# Patient Record
Sex: Male | Born: 1935 | Race: White | Hispanic: No | Marital: Married | Smoking: Former smoker
Health system: Southern US, Community
[De-identification: ages and names within clinical notes are randomized; demographics above are authoritative.]

## PROBLEM LIST (undated history)

## (undated) DIAGNOSIS — D696 Thrombocytopenia, unspecified: Secondary | ICD-10-CM

## (undated) DIAGNOSIS — N289 Disorder of kidney and ureter, unspecified: Secondary | ICD-10-CM

## (undated) DIAGNOSIS — E119 Type 2 diabetes mellitus without complications: Secondary | ICD-10-CM

## (undated) DIAGNOSIS — F32A Depression, unspecified: Secondary | ICD-10-CM

## (undated) DIAGNOSIS — K219 Gastro-esophageal reflux disease without esophagitis: Secondary | ICD-10-CM

## (undated) DIAGNOSIS — M1990 Unspecified osteoarthritis, unspecified site: Secondary | ICD-10-CM

## (undated) DIAGNOSIS — Z8719 Personal history of other diseases of the digestive system: Secondary | ICD-10-CM

## (undated) DIAGNOSIS — I2510 Atherosclerotic heart disease of native coronary artery without angina pectoris: Secondary | ICD-10-CM

## (undated) DIAGNOSIS — K3184 Gastroparesis: Secondary | ICD-10-CM

## (undated) DIAGNOSIS — J189 Pneumonia, unspecified organism: Secondary | ICD-10-CM

## (undated) DIAGNOSIS — K635 Polyp of colon: Secondary | ICD-10-CM

## (undated) DIAGNOSIS — N189 Chronic kidney disease, unspecified: Secondary | ICD-10-CM

## (undated) DIAGNOSIS — F329 Major depressive disorder, single episode, unspecified: Secondary | ICD-10-CM

## (undated) DIAGNOSIS — I10 Essential (primary) hypertension: Secondary | ICD-10-CM

## (undated) DIAGNOSIS — K5900 Constipation, unspecified: Secondary | ICD-10-CM

## (undated) DIAGNOSIS — E079 Disorder of thyroid, unspecified: Secondary | ICD-10-CM

## (undated) DIAGNOSIS — E13319 Other specified diabetes mellitus with unspecified diabetic retinopathy without macular edema: Secondary | ICD-10-CM

## (undated) DIAGNOSIS — J45909 Unspecified asthma, uncomplicated: Secondary | ICD-10-CM

## (undated) DIAGNOSIS — N400 Enlarged prostate without lower urinary tract symptoms: Secondary | ICD-10-CM

## (undated) DIAGNOSIS — K5792 Diverticulitis of intestine, part unspecified, without perforation or abscess without bleeding: Secondary | ICD-10-CM

## (undated) DIAGNOSIS — E039 Hypothyroidism, unspecified: Secondary | ICD-10-CM

## (undated) HISTORY — PX: KNEE DISLOCATION SURGERY: SHX689

## (undated) HISTORY — PX: AORTIC VALVE REPLACEMENT: SHX41

## (undated) HISTORY — PX: COLONOSCOPY: SHX174

## (undated) HISTORY — PX: SALIVARY STONE REMOVAL: SHX5213

## (undated) HISTORY — PX: CHOLECYSTECTOMY: SHX55

## (undated) HISTORY — PX: CARDIAC CATHETERIZATION: SHX172

## (undated) HISTORY — PX: HERNIA REPAIR: SHX51

---

## 2017-09-30 ENCOUNTER — Other Ambulatory Visit: Payer: Self-pay | Admitting: Neurological Surgery

## 2017-10-03 ENCOUNTER — Other Ambulatory Visit: Payer: Self-pay

## 2017-10-03 ENCOUNTER — Encounter (HOSPITAL_COMMUNITY): Payer: Self-pay | Admitting: *Deleted

## 2017-10-03 NOTE — Progress Notes (Signed)
Spoke with pt for pre-op call. Pt has hx of Aortic Valve Replacement. Sees Dr. Leeann Mustenaldo, cardiologist. Last office visit was August 2018 and was told to follow up in a year. Pt denies any recent chest pain or sob. Pt is a type 2 diabetic. Last A1C was 7.6 on 10/02/17. He stated that his fasting blood sugar has been running in the 140's which is higher than usual due to the back pain. Pt was instructed to take 1/2 of his regular dose of Levemir Insulin Sunday evening (will take 18 units). Pt instructed to check his blood sugar Monday AM when he gets up and every 2 hours until he leaves for the hospital. If blood sugar is >220 take 1/2 of usual correction dose of Humalog insulin. If blood sugar is 70 or below, treat with 1/2 cup of clear juice (apple or cranberry) and recheck blood sugar 15 minutes after drinking juice. If blood sugar continues to be 70 or below, call the Short Stay department and ask to speak to a nurse. Pt voiced understanding.

## 2017-10-03 NOTE — Progress Notes (Signed)
Anesthesia Chart Review:  Pt is a same day work up.   Pt is an 84 year old male scheduled for right L4-5 laminectomy/foraminotomy for facet/synovial cyst on 10/06/2017 with Barnett AbuHenry Elsner, MD  - PCP is Mora ApplBruce Brasher, MD (notes in care everywhere)  - Cardiologist is Gerda DissGary Reynaldo, MD. Last office visit 04/25/17; 1 year f/u recommended (notes in care everywhere)   PMH includes:  CAD, HTN, DM, CKD, thyroid disease. Former smoker. S/p aortic valve replacement with bioprosthetic valve 2009  Medications include: albuterol, ASA 81mg , carvedilol, humalog, duoneb, levemir, levothyroxine, lisinopril, lovastatin, maxzide  Labs will be obtained day of surgery  Nuclear stress test 02/27/17 (care everywhere): Normal study.  Holter monitor 01/02/16 (care everywhere):  - The rhythm throughout the tracing is atrial fibrillation with a heart rate range of 43-91 and an average of 69.Rare PVCs are present.No symptoms were reported.No pauses were present.  Echo 02/21/15 [facility]): requested  - by notes, echo showed: normal LV the function and his bioprosthetic valve has some mild aortic insufficiency but otherwise seems to be fine. He has 1-2+ mitral insufficiency and mild tricuspid insufficiency. There is been no significant decline since his previous study 2 years ago.   Cardiac cath 10/23/07 (care everywhere):  1.Severe aortic stenosis. 2.Normal intracardiac filling pressures. 3.Mild nonobstructive coronary disease.  If labs acceptable day of surgery, I anticipate pt can proceed with surgery as scheduled.   Rica Mastngela Kabbe, FNP-BC [facility] Phone: [phone] 10/03/2017 4:46 PM

## 2017-10-06 ENCOUNTER — Inpatient Hospital Stay (HOSPITAL_COMMUNITY): Payer: Medicare Other | Admitting: Emergency Medicine

## 2017-10-06 ENCOUNTER — Encounter (HOSPITAL_COMMUNITY): Payer: Self-pay | Admitting: *Deleted

## 2017-10-06 ENCOUNTER — Encounter (HOSPITAL_COMMUNITY)
Admission: RE | Disposition: A | Discharge status: Home / Self Care | Payer: Self-pay | Source: Ambulatory Visit | Attending: Neurological Surgery

## 2017-10-06 ENCOUNTER — Inpatient Hospital Stay (HOSPITAL_COMMUNITY): Payer: Medicare Other

## 2017-10-06 ENCOUNTER — Observation Stay (HOSPITAL_COMMUNITY)
Admission: RE | Admit: 2017-10-06 | Discharge: 2017-10-07 | Disposition: A | Discharge status: Home / Self Care | Payer: Medicare Other | Source: Ambulatory Visit | Attending: Neurological Surgery | Admitting: Neurological Surgery

## 2017-10-06 DIAGNOSIS — N189 Chronic kidney disease, unspecified: Secondary | ICD-10-CM | POA: Diagnosis not present

## 2017-10-06 DIAGNOSIS — M48061 Spinal stenosis, lumbar region without neurogenic claudication: Secondary | ICD-10-CM | POA: Diagnosis not present

## 2017-10-06 DIAGNOSIS — I129 Hypertensive chronic kidney disease with stage 1 through stage 4 chronic kidney disease, or unspecified chronic kidney disease: Secondary | ICD-10-CM | POA: Insufficient documentation

## 2017-10-06 DIAGNOSIS — M549 Dorsalgia, unspecified: Secondary | ICD-10-CM | POA: Diagnosis present

## 2017-10-06 DIAGNOSIS — I2510 Atherosclerotic heart disease of native coronary artery without angina pectoris: Secondary | ICD-10-CM | POA: Diagnosis not present

## 2017-10-06 DIAGNOSIS — Z79899 Other long term (current) drug therapy: Secondary | ICD-10-CM | POA: Insufficient documentation

## 2017-10-06 DIAGNOSIS — Z794 Long term (current) use of insulin: Secondary | ICD-10-CM | POA: Diagnosis not present

## 2017-10-06 DIAGNOSIS — M5116 Intervertebral disc disorders with radiculopathy, lumbar region: Principal | ICD-10-CM | POA: Insufficient documentation

## 2017-10-06 DIAGNOSIS — M4316 Spondylolisthesis, lumbar region: Secondary | ICD-10-CM | POA: Insufficient documentation

## 2017-10-06 DIAGNOSIS — E1122 Type 2 diabetes mellitus with diabetic chronic kidney disease: Secondary | ICD-10-CM | POA: Insufficient documentation

## 2017-10-06 DIAGNOSIS — I083 Combined rheumatic disorders of mitral, aortic and tricuspid valves: Secondary | ICD-10-CM | POA: Diagnosis not present

## 2017-10-06 DIAGNOSIS — Z87891 Personal history of nicotine dependence: Secondary | ICD-10-CM | POA: Diagnosis not present

## 2017-10-06 DIAGNOSIS — Z7982 Long term (current) use of aspirin: Secondary | ICD-10-CM | POA: Insufficient documentation

## 2017-10-06 DIAGNOSIS — Z952 Presence of prosthetic heart valve: Secondary | ICD-10-CM | POA: Diagnosis not present

## 2017-10-06 DIAGNOSIS — M5416 Radiculopathy, lumbar region: Secondary | ICD-10-CM | POA: Diagnosis present

## 2017-10-06 DIAGNOSIS — E079 Disorder of thyroid, unspecified: Secondary | ICD-10-CM | POA: Diagnosis not present

## 2017-10-06 DIAGNOSIS — Z419 Encounter for procedure for purposes other than remedying health state, unspecified: Secondary | ICD-10-CM

## 2017-10-06 HISTORY — DX: Polyp of colon: K63.5

## 2017-10-06 HISTORY — DX: Gastroparesis: K31.84

## 2017-10-06 HISTORY — DX: Constipation, unspecified: K59.00

## 2017-10-06 HISTORY — DX: Type 2 diabetes mellitus without complications: E11.9

## 2017-10-06 HISTORY — DX: Disorder of thyroid, unspecified: E07.9

## 2017-10-06 HISTORY — DX: Unspecified osteoarthritis, unspecified site: M19.90

## 2017-10-06 HISTORY — DX: Essential (primary) hypertension: I10

## 2017-10-06 HISTORY — DX: Unspecified asthma, uncomplicated: J45.909

## 2017-10-06 HISTORY — DX: Major depressive disorder, single episode, unspecified: F32.9

## 2017-10-06 HISTORY — DX: Thrombocytopenia, unspecified: D69.6

## 2017-10-06 HISTORY — PX: LUMBAR LAMINECTOMY/DECOMPRESSION MICRODISCECTOMY: SHX5026

## 2017-10-06 HISTORY — DX: Gastro-esophageal reflux disease without esophagitis: K21.9

## 2017-10-06 HISTORY — DX: Diverticulitis of intestine, part unspecified, without perforation or abscess without bleeding: K57.92

## 2017-10-06 HISTORY — DX: Disorder of kidney and ureter, unspecified: N28.9

## 2017-10-06 HISTORY — DX: Atherosclerotic heart disease of native coronary artery without angina pectoris: I25.10

## 2017-10-06 HISTORY — DX: Benign prostatic hyperplasia without lower urinary tract symptoms: N40.0

## 2017-10-06 HISTORY — DX: Hypothyroidism, unspecified: E03.9

## 2017-10-06 HISTORY — DX: Depression, unspecified: F32.A

## 2017-10-06 HISTORY — DX: Chronic kidney disease, unspecified: N18.9

## 2017-10-06 HISTORY — DX: Personal history of other diseases of the digestive system: Z87.19

## 2017-10-06 HISTORY — DX: Other specified diabetes mellitus with unspecified diabetic retinopathy without macular edema: E13.319

## 2017-10-06 HISTORY — DX: Pneumonia, unspecified organism: J18.9

## 2017-10-06 LAB — CBC
HCT: 43 % (ref 39.0–52.0)
HEMOGLOBIN: 14.6 g/dL (ref 13.0–17.0)
MCH: 29.1 pg (ref 26.0–34.0)
MCHC: 34 g/dL (ref 30.0–36.0)
MCV: 85.8 fL (ref 78.0–100.0)
Platelets: 116 10*3/uL — ABNORMAL LOW (ref 150–400)
RBC: 5.01 MIL/uL (ref 4.22–5.81)
RDW: 14.1 % (ref 11.5–15.5)
WBC: 6 10*3/uL (ref 4.0–10.5)

## 2017-10-06 LAB — GLUCOSE, CAPILLARY
GLUCOSE-CAPILLARY: 216 mg/dL — AB (ref 65–99)
GLUCOSE-CAPILLARY: 239 mg/dL — AB (ref 65–99)
Glucose-Capillary: 228 mg/dL — ABNORMAL HIGH (ref 65–99)
Glucose-Capillary: 297 mg/dL — ABNORMAL HIGH (ref 65–99)
Glucose-Capillary: 208 mg/dL — ABNORMAL HIGH (ref 65–99)

## 2017-10-06 LAB — BASIC METABOLIC PANEL
Anion gap: 12 (ref 5–15)
BUN: 27 mg/dL — AB (ref 6–20)
CALCIUM: 10.1 mg/dL (ref 8.9–10.3)
CO2: 24 mmol/L (ref 22–32)
CREATININE: 1.53 mg/dL — AB (ref 0.61–1.24)
Chloride: 102 mmol/L (ref 101–111)
GFR calc Af Amer: 47 mL/min — ABNORMAL LOW (ref >60)
GFR, EST NON AFRICAN AMERICAN: 40 mL/min — AB (ref >60)
Glucose, Bld: 242 mg/dL — ABNORMAL HIGH (ref 65–99)
Potassium: 4.1 mmol/L (ref 3.5–5.1)
Sodium: 138 mmol/L (ref 135–145)

## 2017-10-06 SURGERY — LUMBAR LAMINECTOMY/DECOMPRESSION MICRODISCECTOMY 1 LEVEL
Anesthesia: General | Site: Spine Lumbar | Laterality: Right

## 2017-10-06 MED ORDER — SODIUM CHLORIDE 0.9% FLUSH
3.0000 mL | Freq: Two times a day (BID) | INTRAVENOUS | Status: DC
  Administered 2017-10-06 (×2): 3 mL via INTRAVENOUS

## 2017-10-06 MED ORDER — THROMBIN (RECOMBINANT) 5000 UNITS EX SOLR
CUTANEOUS | Status: DC | PRN
  Administered 2017-10-06 (×2): 5000 [IU] via TOPICAL

## 2017-10-06 MED ORDER — VANCOMYCIN HCL IN DEXTROSE 1-5 GM/200ML-% IV SOLN
1000.0000 mg | Freq: Once | INTRAVENOUS | Status: AC
  Administered 2017-10-06: 1000 mg via INTRAVENOUS

## 2017-10-06 MED ORDER — THROMBIN (RECOMBINANT) 5000 UNITS EX SOLR
CUTANEOUS | Status: AC
  Filled 2017-10-06: qty 10000

## 2017-10-06 MED ORDER — LIDOCAINE-EPINEPHRINE 1 %-1:100000 IJ SOLN
INTRAMUSCULAR | Status: DC | PRN
  Administered 2017-10-06: 5 mL

## 2017-10-06 MED ORDER — ACETAMINOPHEN 650 MG RE SUPP: 650.0000 mg | RECTAL | Status: DC | PRN

## 2017-10-06 MED ORDER — TRIAMTERENE-HCTZ 75-50 MG PO TABS
0.5000 | ORAL_TABLET | Freq: Every day | ORAL | Status: DC
  Filled 2017-10-06: qty 0.5

## 2017-10-06 MED ORDER — ONDANSETRON HCL 4 MG/2ML IJ SOLN: 4.0000 mg | Freq: Once | INTRAMUSCULAR | Status: DC | PRN

## 2017-10-06 MED ORDER — LIDOCAINE 2% (20 MG/ML) 5 ML SYRINGE
INTRAMUSCULAR | Status: DC | PRN
  Administered 2017-10-06: 100 mg via INTRAVENOUS

## 2017-10-06 MED ORDER — FENTANYL CITRATE (PF) 100 MCG/2ML IJ SOLN
INTRAMUSCULAR | Status: DC | PRN
  Administered 2017-10-06: 100 ug via INTRAVENOUS
  Administered 2017-10-06: 50 ug via INTRAVENOUS

## 2017-10-06 MED ORDER — HYDROMORPHONE HCL 1 MG/ML IJ SOLN: 0.2500 mg | INTRAMUSCULAR | Status: DC | PRN

## 2017-10-06 MED ORDER — LACTATED RINGERS IV SOLN
INTRAVENOUS | Status: DC | PRN
  Administered 2017-10-06: 08:00:00 via INTRAVENOUS

## 2017-10-06 MED ORDER — ONDANSETRON HCL 4 MG/2ML IJ SOLN
INTRAMUSCULAR | Status: DC | PRN
  Administered 2017-10-06: 4 mg via INTRAVENOUS

## 2017-10-06 MED ORDER — ONDANSETRON HCL 4 MG PO TABS: 4.0000 mg | ORAL_TABLET | Freq: Four times a day (QID) | ORAL | Status: DC | PRN

## 2017-10-06 MED ORDER — ONDANSETRON HCL 4 MG/2ML IJ SOLN
INTRAMUSCULAR | Status: AC
  Filled 2017-10-06: qty 2

## 2017-10-06 MED ORDER — EPHEDRINE SULFATE-NACL 50-0.9 MG/10ML-% IV SOSY
PREFILLED_SYRINGE | INTRAVENOUS | Status: DC | PRN
  Administered 2017-10-06 (×2): 10 mg via INTRAVENOUS

## 2017-10-06 MED ORDER — LEVOTHYROXINE SODIUM 75 MCG PO TABS
75.0000 ug | ORAL_TABLET | Freq: Every day | ORAL | Status: DC
  Filled 2017-10-06: qty 1

## 2017-10-06 MED ORDER — FLUTICASONE PROPIONATE 50 MCG/ACT NA SUSP
2.0000 | Freq: Every day | NASAL | Status: DC | PRN
  Filled 2017-10-06: qty 16

## 2017-10-06 MED ORDER — LIDOCAINE 2% (20 MG/ML) 5 ML SYRINGE
INTRAMUSCULAR | Status: AC
  Filled 2017-10-06: qty 5

## 2017-10-06 MED ORDER — HYDROCODONE-ACETAMINOPHEN 5-325 MG PO TABS
2.0000 | ORAL_TABLET | ORAL | Status: DC | PRN
  Administered 2017-10-06: 2 via ORAL
  Filled 2017-10-06: qty 2

## 2017-10-06 MED ORDER — DOCUSATE SODIUM 100 MG PO CAPS
100.0000 mg | ORAL_CAPSULE | Freq: Two times a day (BID) | ORAL | Status: DC
  Administered 2017-10-06: 100 mg via ORAL
  Filled 2017-10-06: qty 1

## 2017-10-06 MED ORDER — ROCURONIUM BROMIDE 10 MG/ML (PF) SYRINGE
PREFILLED_SYRINGE | INTRAVENOUS | Status: AC
  Filled 2017-10-06: qty 5

## 2017-10-06 MED ORDER — METHOCARBAMOL 1000 MG/10ML IJ SOLN
500.0000 mg | Freq: Four times a day (QID) | INTRAMUSCULAR | Status: DC | PRN
  Filled 2017-10-06: qty 5

## 2017-10-06 MED ORDER — POLYETHYLENE GLYCOL 3350 17 G PO PACK: 17.0000 g | PACK | Freq: Every day | ORAL | Status: DC | PRN

## 2017-10-06 MED ORDER — ALUM & MAG HYDROXIDE-SIMETH 200-200-20 MG/5ML PO SUSP: 30.0000 mL | Freq: Four times a day (QID) | ORAL | Status: DC | PRN

## 2017-10-06 MED ORDER — IPRATROPIUM-ALBUTEROL 0.5-2.5 (3) MG/3ML IN SOLN: 3.0000 mL | Freq: Four times a day (QID) | RESPIRATORY_TRACT | Status: DC | PRN

## 2017-10-06 MED ORDER — ALBUTEROL SULFATE (2.5 MG/3ML) 0.083% IN NEBU
2.5000 mg | INHALATION_SOLUTION | Freq: Four times a day (QID) | RESPIRATORY_TRACT | Status: DC | PRN
  Filled 2017-10-06: qty 3

## 2017-10-06 MED ORDER — PHENYLEPHRINE 40 MCG/ML (10ML) SYRINGE FOR IV PUSH (FOR BLOOD PRESSURE SUPPORT)
PREFILLED_SYRINGE | INTRAVENOUS | Status: AC
  Filled 2017-10-06: qty 10

## 2017-10-06 MED ORDER — ALPRAZOLAM 0.5 MG PO TABS: 0.5000 mg | ORAL_TABLET | Freq: Every evening | ORAL | Status: DC | PRN

## 2017-10-06 MED ORDER — VANCOMYCIN HCL IN DEXTROSE 1-5 GM/200ML-% IV SOLN
INTRAVENOUS | Status: AC
  Filled 2017-10-06: qty 200

## 2017-10-06 MED ORDER — PHENOL 1.4 % MT LIQD: 1.0000 | OROMUCOSAL | Status: DC | PRN

## 2017-10-06 MED ORDER — LIDOCAINE-EPINEPHRINE 1 %-1:100000 IJ SOLN
INTRAMUSCULAR | Status: AC
  Filled 2017-10-06: qty 1

## 2017-10-06 MED ORDER — EPHEDRINE 5 MG/ML INJ
INTRAVENOUS | Status: AC
  Filled 2017-10-06: qty 10

## 2017-10-06 MED ORDER — SENNA 8.6 MG PO TABS: 1.0000 | ORAL_TABLET | Freq: Two times a day (BID) | ORAL | Status: DC

## 2017-10-06 MED ORDER — SODIUM CHLORIDE 0.9 % IR SOLN
Status: DC | PRN
  Administered 2017-10-06: 10:00:00

## 2017-10-06 MED ORDER — DEXAMETHASONE SODIUM PHOSPHATE 4 MG/ML IJ SOLN
INTRAMUSCULAR | Status: DC | PRN
  Administered 2017-10-06: 4 mg via INTRAVENOUS

## 2017-10-06 MED ORDER — METHOCARBAMOL 500 MG PO TABS
500.0000 mg | ORAL_TABLET | Freq: Four times a day (QID) | ORAL | Status: DC | PRN
  Administered 2017-10-06: 500 mg via ORAL
  Filled 2017-10-06: qty 1

## 2017-10-06 MED ORDER — PROPOFOL 10 MG/ML IV BOLUS
INTRAVENOUS | Status: AC
  Filled 2017-10-06: qty 20

## 2017-10-06 MED ORDER — HEMOSTATIC AGENTS (NO CHARGE) OPTIME
TOPICAL | Status: DC | PRN
  Administered 2017-10-06: 1 via TOPICAL

## 2017-10-06 MED ORDER — CHLORHEXIDINE GLUCONATE CLOTH 2 % EX PADS: 6.0000 | MEDICATED_PAD | Freq: Once | CUTANEOUS | Status: DC

## 2017-10-06 MED ORDER — HYDROCODONE-ACETAMINOPHEN 5-325 MG PO TABS: 1.0000 | ORAL_TABLET | ORAL | 0 refills | Status: AC | PRN

## 2017-10-06 MED ORDER — CARVEDILOL 12.5 MG PO TABS
12.5000 mg | ORAL_TABLET | Freq: Two times a day (BID) | ORAL | Status: DC
  Administered 2017-10-06: 12.5 mg via ORAL
  Filled 2017-10-06: qty 1

## 2017-10-06 MED ORDER — SUCCINYLCHOLINE CHLORIDE 200 MG/10ML IV SOSY
PREFILLED_SYRINGE | INTRAVENOUS | Status: AC
  Filled 2017-10-06: qty 10

## 2017-10-06 MED ORDER — SODIUM CHLORIDE 0.9% FLUSH: 3.0000 mL | INTRAVENOUS | Status: DC | PRN

## 2017-10-06 MED ORDER — FLEET ENEMA 7-19 GM/118ML RE ENEM: 1.0000 | ENEMA | Freq: Once | RECTAL | Status: DC | PRN

## 2017-10-06 MED ORDER — FENTANYL CITRATE (PF) 250 MCG/5ML IJ SOLN
INTRAMUSCULAR | Status: AC
  Filled 2017-10-06: qty 5

## 2017-10-06 MED ORDER — MORPHINE SULFATE (PF) 4 MG/ML IV SOLN: 2.0000 mg | INTRAVENOUS | Status: DC | PRN

## 2017-10-06 MED ORDER — LISINOPRIL 20 MG PO TABS
40.0000 mg | ORAL_TABLET | Freq: Every day | ORAL | Status: DC
  Administered 2017-10-06: 40 mg via ORAL
  Filled 2017-10-06: qty 2

## 2017-10-06 MED ORDER — LACTATED RINGERS IV SOLN
INTRAVENOUS | Status: DC
  Administered 2017-10-06: 08:00:00 via INTRAVENOUS

## 2017-10-06 MED ORDER — PHENYLEPHRINE 40 MCG/ML (10ML) SYRINGE FOR IV PUSH (FOR BLOOD PRESSURE SUPPORT)
PREFILLED_SYRINGE | INTRAVENOUS | Status: DC | PRN
  Administered 2017-10-06: 80 ug via INTRAVENOUS
  Administered 2017-10-06: 40 ug via INTRAVENOUS
  Administered 2017-10-06: 80 ug via INTRAVENOUS

## 2017-10-06 MED ORDER — ROCURONIUM BROMIDE 100 MG/10ML IV SOLN
INTRAVENOUS | Status: DC | PRN
  Administered 2017-10-06: 50 mg via INTRAVENOUS

## 2017-10-06 MED ORDER — INSULIN ASPART 100 UNIT/ML ~~LOC~~ SOLN
12.0000 [IU] | Freq: Three times a day (TID) | SUBCUTANEOUS | Status: DC
  Administered 2017-10-06 (×2): 15 [IU] via SUBCUTANEOUS

## 2017-10-06 MED ORDER — DEXAMETHASONE SODIUM PHOSPHATE 10 MG/ML IJ SOLN
INTRAMUSCULAR | Status: AC
  Filled 2017-10-06: qty 1

## 2017-10-06 MED ORDER — SUGAMMADEX SODIUM 200 MG/2ML IV SOLN
INTRAVENOUS | Status: DC | PRN
  Administered 2017-10-06: 200 mg via INTRAVENOUS

## 2017-10-06 MED ORDER — PRAVASTATIN SODIUM 40 MG PO TABS
40.0000 mg | ORAL_TABLET | Freq: Every day | ORAL | Status: DC
  Administered 2017-10-06: 40 mg via ORAL
  Filled 2017-10-06: qty 1

## 2017-10-06 MED ORDER — INSULIN DETEMIR 100 UNIT/ML ~~LOC~~ SOLN
30.0000 [IU] | Freq: Every day | SUBCUTANEOUS | Status: DC
  Administered 2017-10-06: 30 [IU] via SUBCUTANEOUS
  Filled 2017-10-06: qty 0.3

## 2017-10-06 MED ORDER — ACETAMINOPHEN 325 MG PO TABS: 650.0000 mg | ORAL_TABLET | ORAL | Status: DC | PRN

## 2017-10-06 MED ORDER — METHOCARBAMOL 500 MG PO TABS: 500.0000 mg | ORAL_TABLET | Freq: Four times a day (QID) | ORAL | 2 refills | Status: AC | PRN

## 2017-10-06 MED ORDER — TRIAMTERENE-HCTZ 37.5-25 MG PO TABS
1.0000 | ORAL_TABLET | Freq: Every day | ORAL | Status: DC
  Administered 2017-10-06: 1 via ORAL
  Filled 2017-10-06 (×2): qty 1

## 2017-10-06 MED ORDER — BUPIVACAINE HCL (PF) 0.5 % IJ SOLN
INTRAMUSCULAR | Status: AC
  Filled 2017-10-06: qty 30

## 2017-10-06 MED ORDER — SUGAMMADEX SODIUM 200 MG/2ML IV SOLN
INTRAVENOUS | Status: AC
  Filled 2017-10-06: qty 2

## 2017-10-06 MED ORDER — ARTIFICIAL TEARS OPHTHALMIC OINT
TOPICAL_OINTMENT | OPHTHALMIC | Status: DC | PRN
  Administered 2017-10-06: 1 via OPHTHALMIC

## 2017-10-06 MED ORDER — HYDROCODONE-ACETAMINOPHEN 5-325 MG PO TABS
1.0000 | ORAL_TABLET | ORAL | Status: DC | PRN
  Administered 2017-10-06 – 2017-10-07 (×2): 1 via ORAL
  Filled 2017-10-06 (×2): qty 1

## 2017-10-06 MED ORDER — SENNA 8.6 MG PO TABS
1.0000 | ORAL_TABLET | Freq: Two times a day (BID) | ORAL | Status: DC
  Administered 2017-10-06: 8.6 mg via ORAL
  Filled 2017-10-06: qty 1

## 2017-10-06 MED ORDER — 0.9 % SODIUM CHLORIDE (POUR BTL) OPTIME
TOPICAL | Status: DC | PRN
  Administered 2017-10-06: 1000 mL

## 2017-10-06 MED ORDER — MEPERIDINE HCL 25 MG/ML IJ SOLN: 6.2500 mg | INTRAMUSCULAR | Status: DC | PRN

## 2017-10-06 MED ORDER — BUPIVACAINE HCL (PF) 0.5 % IJ SOLN
INTRAMUSCULAR | Status: DC | PRN
  Administered 2017-10-06: 5 mL
  Administered 2017-10-06: 20 mL

## 2017-10-06 MED ORDER — PHENYLEPHRINE HCL 10 MG/ML IJ SOLN
INTRAVENOUS | Status: DC | PRN
  Administered 2017-10-06: 80 ug/min via INTRAVENOUS
  Administered 2017-10-06: 40 ug/min via INTRAVENOUS

## 2017-10-06 MED ORDER — ONDANSETRON HCL 4 MG/2ML IJ SOLN: 4.0000 mg | Freq: Four times a day (QID) | INTRAMUSCULAR | Status: DC | PRN

## 2017-10-06 MED ORDER — BISACODYL 10 MG RE SUPP: 10.0000 mg | Freq: Every day | RECTAL | Status: DC | PRN

## 2017-10-06 MED ORDER — MENTHOL 3 MG MT LOZG: 1.0000 | LOZENGE | OROMUCOSAL | Status: DC | PRN

## 2017-10-06 MED ORDER — PROPOFOL 10 MG/ML IV BOLUS
INTRAVENOUS | Status: DC | PRN
  Administered 2017-10-06: 80 mg via INTRAVENOUS

## 2017-10-06 SURGICAL SUPPLY — 55 items
BAG DECANTER FOR FLEXI CONT (MISCELLANEOUS) ×3 IMPLANT
BLADE CLIPPER SURG (BLADE) IMPLANT
BUR ACORN 6.0 (BURR) IMPLANT
BUR ACORN 6.0MM (BURR)
BUR MATCHSTICK NEURO 3.0 LAGG (BURR) ×3 IMPLANT
CANISTER SUCT 3000ML PPV (MISCELLANEOUS) ×3 IMPLANT
CARTRIDGE OIL MAESTRO DRILL (MISCELLANEOUS) ×1 IMPLANT
DECANTER SPIKE VIAL GLASS SM (MISCELLANEOUS) ×3 IMPLANT
DERMABOND ADVANCED (GAUZE/BANDAGES/DRESSINGS) ×2
DERMABOND ADVANCED .7 DNX12 (GAUZE/BANDAGES/DRESSINGS) ×1 IMPLANT
DEVICE DISSECT PLASMABLAD 3.0S (MISCELLANEOUS) ×1 IMPLANT
DIFFUSER DRILL AIR PNEUMATIC (MISCELLANEOUS) ×3 IMPLANT
DRAPE HALF SHEET 40X57 (DRAPES) IMPLANT
DRAPE LAPAROTOMY T 102X78X121 (DRAPES) ×3 IMPLANT
DRAPE MICROSCOPE LEICA (MISCELLANEOUS) IMPLANT
DRAPE POUCH INSTRU U-SHP 10X18 (DRAPES) ×3 IMPLANT
DRSG OPSITE POSTOP 3X4 (GAUZE/BANDAGES/DRESSINGS) ×3 IMPLANT
DURAPREP 26ML APPLICATOR (WOUND CARE) ×3 IMPLANT
ELECT REM PT RETURN 9FT ADLT (ELECTROSURGICAL) ×3
ELECTRODE REM PT RTRN 9FT ADLT (ELECTROSURGICAL) ×1 IMPLANT
GAUZE SPONGE 4X4 12PLY STRL (GAUZE/BANDAGES/DRESSINGS) ×3 IMPLANT
GAUZE SPONGE 4X4 16PLY XRAY LF (GAUZE/BANDAGES/DRESSINGS) IMPLANT
GLOVE BIOGEL PI IND STRL 7.0 (GLOVE) ×1 IMPLANT
GLOVE BIOGEL PI IND STRL 7.5 (GLOVE) ×1 IMPLANT
GLOVE BIOGEL PI IND STRL 8.5 (GLOVE) ×1 IMPLANT
GLOVE BIOGEL PI INDICATOR 7.0 (GLOVE) ×2
GLOVE BIOGEL PI INDICATOR 7.5 (GLOVE) ×2
GLOVE BIOGEL PI INDICATOR 8.5 (GLOVE) ×2
GLOVE ECLIPSE 8.5 STRL (GLOVE) ×3 IMPLANT
GLOVE ECLIPSE 9.0 STRL (GLOVE) ×3 IMPLANT
GLOVE SS N UNI LF 6.5 STRL (GLOVE) ×6 IMPLANT
GLOVE SS N UNI LF 7.0 STRL (GLOVE) ×6 IMPLANT
GOWN STRL REUS W/ TWL LRG LVL3 (GOWN DISPOSABLE) IMPLANT
GOWN STRL REUS W/ TWL XL LVL3 (GOWN DISPOSABLE) IMPLANT
GOWN STRL REUS W/TWL 2XL LVL3 (GOWN DISPOSABLE) ×3 IMPLANT
GOWN STRL REUS W/TWL LRG LVL3 (GOWN DISPOSABLE)
GOWN STRL REUS W/TWL XL LVL3 (GOWN DISPOSABLE)
KIT BASIN OR (CUSTOM PROCEDURE TRAY) ×3 IMPLANT
KIT ROOM TURNOVER OR (KITS) ×3 IMPLANT
NEEDLE HYPO 22GX1.5 SAFETY (NEEDLE) ×3 IMPLANT
NEEDLE SPNL 20GX3.5 QUINCKE YW (NEEDLE) IMPLANT
NS IRRIG 1000ML POUR BTL (IV SOLUTION) ×3 IMPLANT
OIL CARTRIDGE MAESTRO DRILL (MISCELLANEOUS) ×3
PACK LAMINECTOMY NEURO (CUSTOM PROCEDURE TRAY) ×3 IMPLANT
PAD ARMBOARD 7.5X6 YLW CONV (MISCELLANEOUS) ×9 IMPLANT
PATTIES SURGICAL .5 X1 (DISPOSABLE) ×3 IMPLANT
PLASMABLADE 3.0S (MISCELLANEOUS) ×3
SPONGE SURGIFOAM ABS GEL SZ50 (HEMOSTASIS) ×3 IMPLANT
SUT VIC AB 1 CT1 18XBRD ANBCTR (SUTURE) ×1 IMPLANT
SUT VIC AB 1 CT1 8-18 (SUTURE) ×2
SUT VIC AB 2-0 CP2 18 (SUTURE) ×3 IMPLANT
SUT VIC AB 3-0 SH 8-18 (SUTURE) ×3 IMPLANT
TOWEL GREEN STERILE (TOWEL DISPOSABLE) ×3 IMPLANT
TOWEL GREEN STERILE FF (TOWEL DISPOSABLE) ×3 IMPLANT
WATER STERILE IRR 1000ML POUR (IV SOLUTION) ×3 IMPLANT

## 2017-10-06 NOTE — Transfer of Care (Signed)
Immediate Anesthesia Transfer of Care Note  Patient: Danny Payne  Procedure(s) Performed: Right Lumbar Four-Five Laminectomy/Foraminotomy for facet/synovial cyst (Right Spine Lumbar)  Patient Location: PACU  Anesthesia Type:General  Level of Consciousness: drowsy and patient cooperative  Airway & Oxygen Therapy: Patient Spontanous Breathing and Patient connected to face mask oxygen  Post-op Assessment: Report given to RN and Post -op Vital signs reviewed and stable  Post vital signs: Reviewed and stable  Last Vitals:  Vitals:   10/06/17 0746 10/06/17 0819  BP: (!) 175/86   Pulse: 68   Resp: 18   Temp:  36.7 C  SpO2: 97%     Last Pain:  Vitals:   10/06/17 0819  TempSrc: Oral  PainSc:       Patients Stated Pain Goal: 2 (10/06/17 0729)  Complications: No apparent anesthesia complications

## 2017-10-06 NOTE — Anesthesia Procedure Notes (Signed)
Procedure Name: Intubation Date/Time: 10/06/2017 9:19 AM Performed by: Julian ReilWelty, Macie Baum F, CRNA Pre-anesthesia Checklist: Patient identified, Emergency Drugs available, Suction available, Patient being monitored and Timeout performed Patient Re-evaluated:Patient Re-evaluated prior to induction Oxygen Delivery Method: Circle system utilized Preoxygenation: Pre-oxygenation with 100% oxygen Induction Type: IV induction Ventilation: Mask ventilation without difficulty Laryngoscope Size: Miller and 3 Grade View: Grade I Tube type: Oral Tube size: 7.5 mm Number of attempts: 1 Airway Equipment and Method: Stylet Placement Confirmation: ETT inserted through vocal cords under direct vision,  positive ETCO2 and breath sounds checked- equal and bilateral Secured at: 24 cm Tube secured with: Tape Dental Injury: Teeth and Oropharynx as per pre-operative assessment

## 2017-10-06 NOTE — Progress Notes (Signed)
Patient ID: Danny Payne, male   DOB: 1932/05/18, 84 y.o.   MRN: [id number] Vital signs are stable motor function appears intact doing well

## 2017-10-06 NOTE — Evaluation (Signed)
Physical Therapy Evaluation Patient Details Name: Danny Payne MRN: [id number] DOB: 17-Aug-1932 Today's Date: 10/06/2017   History of Present Illness  patient is an 84 yo male s/p Microdiscectomy L4-5 level right with laminotomy and foraminotomy L4-5 right  Clinical Impression  Patient seen for mobility assessment s/p spinal surgery. Mobilizing well. Educated patient on precautions, mobility expectations, safety and car transfers. No further acute PT needs. Will sign off.     Follow Up Recommendations No PT follow up    Equipment Recommendations  None recommended by PT    Recommendations for Other Services       Precautions / Restrictions Precautions Precautions: Back Precaution Booklet Issued: Yes (comment) Precaution Comments: verbally reviewed Restrictions Weight Bearing Restrictions: No      Mobility  Bed Mobility               General bed mobility comments: received in standing  Transfers                    Ambulation/Gait Ambulation/Gait assistance: Independent Ambulation Distance (Feet): 310 Feet Assistive device: None Gait Pattern/deviations: WFL(Within Functional Limits) Gait velocity: decreased Gait velocity interpretation: Below normal speed for age/gender General Gait Details: slow but steady with ambulation  Stairs            Wheelchair Mobility    Modified Rankin (Stroke Patients Only)       Balance Overall balance assessment: No apparent balance deficits (not formally assessed)                                           Pertinent Vitals/Pain Pain Assessment: Faces Faces Pain Scale: Hurts little more Pain Location: operative site Pain Descriptors / Indicators: Operative site guarding Pain Intervention(s): Monitored during session    Home Living Family/patient expects to be discharged to:: Private residence Living Arrangements: Spouse/significant other Available Help at Discharge: Family Type of  Home: House Home Access: Ramped entrance     Home Layout: One level Home Equipment: Environmental consultant - 2 wheels      Prior Function Level of Independence: Independent               Hand Dominance   Dominant Hand: Right    Extremity/Trunk Assessment   Upper Extremity Assessment Upper Extremity Assessment: Overall WFL for tasks assessed    Lower Extremity Assessment Lower Extremity Assessment: Overall WFL for tasks assessed       Communication   Communication: HOH  Cognition Arousal/Alertness: Awake/alert Behavior During Therapy: WFL for tasks assessed/performed Overall Cognitive Status: Within Functional Limits for tasks assessed                                        General Comments      Exercises     Assessment/Plan    PT Assessment Patent does not need any further PT services  PT Problem List         PT Treatment Interventions      PT Goals (Current goals can be found in the Care Plan section)  Acute Rehab PT Goals Patient Stated Goal: to go home today PT Goal Formulation: All assessment and education complete, DC therapy    Frequency     Barriers to discharge        Co-evaluation  AM-PAC PT "6 Clicks" Daily Activity  Outcome Measure Difficulty turning over in bed (including adjusting bedclothes, sheets and blankets)?: None Difficulty moving from lying on back to sitting on the side of the bed? : None Difficulty sitting down on and standing up from a chair with arms (e.g., wheelchair, bedside commode, etc,.)?: None Help needed moving to and from a bed to chair (including a wheelchair)?: None Help needed walking in hospital room?: None Help needed climbing 3-5 steps with a railing? : A Little 6 Click Score: 23    End of Session   Activity Tolerance: Patient tolerated treatment well     PT Visit Diagnosis: Difficulty in walking, not elsewhere classified (R26.2)    Time: [id number] PT Time Calculation (min)  (ACUTE ONLY): 15 min   Charges:   PT Evaluation $PT Eval Low Complexity: 1 Low     PT G Codes:        Danny Payne, PT DPT  Board Certified Neurologic Specialist [phone]   Danny Payne 10/06/2017, 4:29 PM

## 2017-10-06 NOTE — Discharge Instructions (Signed)
Wound Care Leave incision open to air. You may shower. Do not scrub directly on incision.  Do not put any creams, lotions, or ointments on incision. Activity Walk each and every day, increasing distance each day. No lifting greater than 5 lbs.  Avoid excessive neck motion. No driving for 2 weeks; may ride as a passenger locally.  Diet Resume your normal diet.   Call Your Doctor If Any of These Occur Redness, drainage, or swelling at the wound.  Temperature greater than 101 degrees. Severe pain not relieved by pain medication. Increased difficulty swallowing. Incision starts to come apart. Follow Up Appt Call today for appointment in 2-3 weeks ([phone]) or for problems.  If you have any hardware placed in your spine, you will need an x-ray before your appointment.  

## 2017-10-06 NOTE — Anesthesia Postprocedure Evaluation (Signed)
Anesthesia Post Note  Patient: [name]  Procedure(s) Performed: Right Lumbar Four-Five Laminectomy/Foraminotomy for facet/synovial cyst (Right Spine Lumbar)     Patient location during evaluation: PACU Anesthesia Type: General Level of consciousness: awake and alert Pain management: pain level controlled Vital Signs Assessment: post-procedure vital signs reviewed and stable Respiratory status: spontaneous breathing, nonlabored ventilation, respiratory function stable and patient connected to nasal cannula oxygen Cardiovascular status: blood pressure returned to baseline and stable Postop Assessment: no apparent nausea or vomiting Anesthetic complications: no    Last Vitals:  Vitals:   10/06/17 1056 10/06/17 1104  BP: (!) 148/82 (!) 147/73  Pulse: 61 60  Resp: 19 17  Temp:  36.4 C  SpO2: 98% 97%    Last Pain:  Vitals:   10/06/17 1026  TempSrc:   PainSc: 0-No pain    LLE Motor Response: Purposeful movement;Responds to commands (10/06/17 1104) LLE Sensation: Full sensation;No numbness;No pain;No tingling (10/06/17 1104) RLE Motor Response: Purposeful movement;Responds to commands (10/06/17 1104) RLE Sensation: Full sensation;No numbness;No pain;No tingling (10/06/17 1104)      Danny Payne DAVID

## 2017-10-06 NOTE — H&P (Signed)
  CHIEF COMPLAINT: Back and right lower extremity pain since August of this past year.  HISTORY OF PRESENT ILLNESS: Danny Payne is an 84 year old right-handed individual who tells me that he started having some issues back this summer, where he started experiencing pain in the buttock and right lower extremity. He was getting some potatoes up and he noted that the pain got substantially worse. He had been seen elsewhere and ultimately underwent an MRI of the lumbar spine in September. This demonstrates the presence of a synovial cyst on the right side at the L4-L5 level. He has not had any plain radiographs, and today in the office I obtained a lateral flexion-extension film, plus a singular AP view. These radiographs demonstrate that he has some modest spondylitic changes and a grade 1 spondylolisthesis at L4-L5 that appears to be fairly fixed through flexion and extension. His alignment is good in both the coronal and the sagittal planes.  PAST MEDICAL AND SURGICAL HISTORY: Notable for history of diabetes mellitus. He has been using some insulin 3 times a day for the past several years. He has had valve replacement in the aortic position in 2009; this apparently was a porcine valve. He has not been requiring any anticoagulation other than aspirin a day. He has had gallbladder surgery in the past and cataract surgery.  ALLERGIES: He notes allergies to Actos, Lipitor, and Penicillin.  CURRENT MEDICATIONS: Include Humalog, Levemir, Carvedilol, Triamterene, Lovastatin, Lisinopril, and Levothyroxine 50 mcg.  REVIEW OF SYSTEMS: Notable for sinus problems, swelling in the feet and hands, leg pain while walking, leg weakness, neck pain, arthritis, anxiety, and some thyroid disease, on a 14-point review sheet.  PHYSICAL EXAMINATION: I note that he stands straight and erect. He walks without any antalgia. Motor function does reveal some slight weakness in the tibialis anterior on the right compared to  the left, but overall tone and bulk are intact and his reflexes are 1+ and symmetric. Straight leg raising is negative to 60 degrees bilaterally. Patrick maneuver is negative bilaterally.  IMPRESSION: The patient has evidence of a synovial cyst on the right side at L4-L5. He has a grade 1 spondylolisthesis that appears fairly immobile. I suggested to Mr. Duffy RhodyStanley that it be best to do a small operation, specifically a laminotomy and foraminotomy at L4-L5 on the right side and decompress the L5 nerve root. This should help alleviate the worst of his radicular pain, and, with time, he should be able to get back all his normal activities. I would encourage a period of rest for up to 3 months after the surgery before he starts bending significantly, twisting, and lifting. Nonetheless, he does like to do his activities and I believe that early on restricting his activity substantially would be to his benefit. Otherwise, the problem can recur, which may necessitate further surgery.

## 2017-10-06 NOTE — Discharge Summary (Addendum)
Physician Discharge Summary  Patient ID: Danny Payne MRN: [id number] DOB/AGE: 12/25/1931 84 y.o.  Admit date: 10/06/2017 Discharge date: 10/07/2017  Admission Diagnoses: Spondylolisthesis L4-L5 with right lumbar radiculopathy,  Discharge Diagnoses: Spondylolisthesis L4-L5 with right lumbar radiculopathy, herniated nucleus pulposus L4-5 right, diabetes mellitus.  Active Problems:   Lumbar radiculopathy, chronic   Discharged Condition: good  Hospital Course: Patient was admitted to undergo surgical decompression which she tolerated well is ambulatory.  Consults: None  Significant Diagnostic Studies: None  Treatments: surgery: Microdiscectomy L4-5 level right with laminotomy and foraminotomy L4-5 right  Discharge Exam: Blood pressure (!) 147/77, pulse 64, temperature (!) 97.5 F (36.4 C), resp. rate 16, height 5\' 10"  (1.778 m), weight 78.5 kg (173 lb), SpO2 99 %. Incision is clean and dry. Station and gait is intact.  Disposition: Discharge home  Discharge Instructions    Call MD for:  redness, tenderness, or signs of infection (pain, swelling, redness, odor or green/yellow discharge around incision site)   Complete by:  As directed    Call MD for:  severe uncontrolled pain   Complete by:  As directed    Call MD for:  temperature >100.4   Complete by:  As directed    Diet - low sodium heart healthy   Complete by:  As directed    Discharge instructions   Complete by:  As directed    Okay to shower. Do not apply salves or appointments to incision. No heavy lifting with the upper extremities greater than 15 pounds. May resume driving when not requiring pain medication for 1 week and patient feels comfortable with doing so.   Incentive spirometry RT   Complete by:  As directed    Increase activity slowly   Complete by:  As directed      Allergies as of 10/06/2017      Reactions   Darvon [propoxyphene] Swelling   Metformin And Related Swelling   Penicillins Swelling    Actos [pioglitazone] Other (See Comments)   Chest pain   Augmentin [amoxicillin-pot Clavulanate] Swelling, Other (See Comments)   Has patient had a PCN reaction causing immediate rash, facial/tongue/throat swelling, SOB or lightheadedness with hypotension: No Has patient had a PCN reaction causing severe rash involving mucus membranes or skin necrosis: No Has patient had a PCN reaction that required hospitalization: No Has patient had a PCN reaction occurring within the last 10 years: No If all of the above answers are "NO", then may proceed with Cephalosporin use.   Cozaar [losartan Potassium] Swelling   Diovan [valsartan] Swelling   Swelling in throat   Zestril [lisinopril] Other (See Comments)   Unknown   Lipitor [atorvastatin Calcium] Rash      Medication List    TAKE these medications   albuterol 108 (90 Base) MCG/ACT inhaler Commonly known as:  PROVENTIL HFA;VENTOLIN HFA Inhale 2 puffs into the lungs every 6 (six) hours as needed for wheezing or shortness of breath.   ALPRAZolam 0.5 MG tablet Commonly known as:  XANAX Take 0.5 mg by mouth at bedtime as needed for anxiety (takes 1/2 tablet).   aspirin EC 81 MG tablet Take 81 mg by mouth at bedtime.   carvedilol 12.5 MG tablet Commonly known as:  COREG Take 12.5 mg by mouth 2 (two) times daily with a meal.   [employer] SENNA 8.6 MG tablet Generic drug:  senna Take 1 tablet by mouth 2 (two) times daily.   Fish Oil 1000 MG Caps Take 3,000 mg by mouth daily.  fluticasone 50 MCG/ACT nasal spray Commonly known as:  FLONASE Place 2 sprays into both nostrils daily as needed for allergies or rhinitis.   HUMALOG 100 UNIT/ML injection Generic drug:  insulin lispro Inject 12-15 Units into the skin 3 (three) times daily with meals. Per sliding scale   HYDROcodone-acetaminophen 5-325 MG tablet Commonly known as:  NORCO/VICODIN Take 1-2 tablets by mouth every 4 (four) hours as needed for severe pain ((score 7 to 10)).    ipratropium-albuterol 0.5-2.5 (3) MG/3ML Soln Commonly known as:  DUONEB Take 3 mLs by nebulization every 6 (six) hours as needed (for shortness of breath or wheezing).   LEVEMIR 100 UNIT/ML injection Generic drug:  insulin detemir Inject 30 Units into the skin at bedtime.   levothyroxine 75 MCG tablet Commonly known as:  SYNTHROID, LEVOTHROID Take 75 mcg by mouth daily before breakfast.   lisinopril 40 MG tablet Commonly known as:  PRINIVIL,ZESTRIL Take 40 mg by mouth daily.   lovastatin 40 MG tablet Commonly known as:  MEVACOR Take 40 mg by mouth at bedtime.   methocarbamol 500 MG tablet Commonly known as:  ROBAXIN Take 1 tablet (500 mg total) by mouth every 6 (six) hours as needed for muscle spasms.   naproxen sodium 220 MG tablet Commonly known as:  ALEVE Take 220 mg by mouth daily as needed (for pain or headache).   polyethylene glycol packet Commonly known as:  MIRALAX / GLYCOLAX Take 17 g by mouth daily as needed for moderate constipation.   triamterene-hydrochlorothiazide 75-50 MG tablet Commonly known as:  MAXZIDE Take 0.5 tablets by mouth daily.        SignedStefani Dama: [name] 10/06/2017, 12:16 PM

## 2017-10-06 NOTE — Progress Notes (Signed)
Spoke with Dr Danielle DessElsner about allergy to pcn new orders noted.

## 2017-10-06 NOTE — Anesthesia Preprocedure Evaluation (Signed)
Anesthesia Evaluation  Patient identified by MRN, date of birth, ID band Patient awake    Reviewed: Allergy & Precautions, NPO status , Patient's Chart, lab work & pertinent test results  Airway Mallampati: I  TM Distance: >3 FB Neck ROM: Full    Dental   Pulmonary former smoker,    Pulmonary exam normal        Cardiovascular hypertension, Pt. on medications + CAD  Normal cardiovascular exam     Neuro/Psych    GI/Hepatic   Endo/Other  diabetes, Type 2, Oral Hypoglycemic Agents  Renal/GU Renal InsufficiencyRenal disease     Musculoskeletal   Abdominal   Peds  Hematology   Anesthesia Other Findings   Reproductive/Obstetrics                             Anesthesia Physical Anesthesia Plan  ASA: II  Anesthesia Plan: General   Post-op Pain Management:    Induction: Intravenous  PONV Risk Score and Plan: 2  Airway Management Planned: Oral ETT  Additional Equipment:   Intra-op Plan:   Post-operative Plan: Extubation in OR  Informed Consent: I have reviewed the patients History and Physical, chart, labs and discussed the procedure including the risks, benefits and alternatives for the proposed anesthesia with the patient or authorized representative who has indicated his/her understanding and acceptance.     Plan Discussed with: CRNA and Surgeon  Anesthesia Plan Comments:         Anesthesia Quick Evaluation

## 2017-10-06 NOTE — Op Note (Signed)
Date of surgery: 10/06/2017 Preoperative diagnosis: Right lumbar radiculopathy L5 distribution, spondylolisthesis L4-5, synovial cyst right L4-5 Postoperative diagnosis: Right lumbar radiculopathy L5 distribution, spondylolisthesis L4-5, synovial cyst L4-5, herniated nucleus pulposus L4-5 right Procedure: Laminotomy and foraminotomy L4-5 right with discectomy L4-5 right Surgeon: Barnett AbuHenry Felice Hope First assistant: Julio SicksHenry Pool M.D. Anesthesia: Gen. endotracheal Indications: Dwaine DeterDonald Bohr is an 84 year old individual who's had significant right lumbar radiculopathy for a number of months. An MRI demonstrates the presence of lateral recess stenosis on the right side at L4-L5 with a grade 1 spondylolisthesis. He's had efforts at conservative management including several injections and trials of the passage of time but despite this he has worsening pain. Been advised regarding a laminotomy and foraminotomy on the right side at L4-5.  Procedure: Patient was brought to the operating supine on a stretcher. After the smooth induction of general endotracheal anesthesia, he was turned prone. The back was prepped without wall DuraPrep and draped in a sterile fashion. A midline incision was created and carried down to the lumbar dorsal fascia on the right side. Subperiosteal dissection was undertaken interlaminar space that was ultimately identified as L4-L5 a localizing radiograph. Then the inferior margin lamina of L4 out to and including the medial wall the facet was taken down. This was done with a small high-speed bur and to a half millimeter matchstick. The yellow ligament was taken up. The yellow ligament was thickened in its lateral recess and this allowed for some lateral recess compression. Was carefully dissected and then gradually the common dural tube and the L5 nerve root takeoff region was mobilized. Underneath this was noted to be a significant mass ON further dissection was noted that this was a subacute  ligamentous herniation of the disc. A portion of the disc was poking through the ligament and this was opened with a 15 blade. A singular large fragment of disc was removed from this region this immediately gave the decompression for the common dural tube and the take off the L5 nerve root. The area around this was explored but no other disc material was had from within the opening. With this the area was copiously irrigated with antibiotic irrigating solution hemostasis was checked and the soft tissues in the lumbar dorsal fascia was reapproximated with #1 Vicryl 20 mL of half percent Marcaine was injected into the paraspinous fascia. 2-0 Vicryl was used in subcutaneous tissues and 3-0 Vicryl subcuticularly. Dermabond was used on the skin. Patient was returned to recovery room in stable condition.

## 2017-10-07 ENCOUNTER — Encounter (HOSPITAL_COMMUNITY): Payer: Self-pay | Admitting: Neurological Surgery

## 2017-10-07 DIAGNOSIS — M5116 Intervertebral disc disorders with radiculopathy, lumbar region: Secondary | ICD-10-CM | POA: Diagnosis not present

## 2017-10-07 LAB — GLUCOSE, CAPILLARY: Glucose-Capillary: 207 mg/dL — ABNORMAL HIGH (ref 65–99)

## 2017-10-07 NOTE — Evaluation (Signed)
Occupational Therapy Evaluation Patient Details Name: Danny Payne MRN: [id number] DOB: June 01, 1932 Today's Date: 10/07/2017    History of Present Illness patient is an 84 yo male s/p Microdiscectomy L4-5 level right with laminotomy and foraminotomy L4-5 right   Clinical Impression   Patient evaluated by Occupational Therapy with no further acute OT needs identified. All education has been completed and the patient has no further questions. See below for any follow-up Occupational Therapy or equipment needs. OT to sign off. Thank you for referral.   Pt eager to get home to his dog "tiny"    Follow Up Recommendations  No OT follow up    Equipment Recommendations  None recommended by OT    Recommendations for Other Services       Precautions / Restrictions Precautions Precautions: Back Precaution Comments: pt unable to verbalize back precautions and teach back used throughouts session to ensure patient knew them by the end of session      Mobility Bed Mobility               General bed mobility comments: in chair on arrival  Transfers Overall transfer level: Modified independent                    Balance                                           ADL either performed or assessed with clinical judgement   ADL Overall ADL's : Modified independent                                       General ADL Comments: pt able to cross bil LE and able to demonstrate tub transfer. pt educated on setting an alarm at night for medications and he is not allowed to mow the yard. Pt encouraged to avoid the golf cart initially   Back handout provided and reviewed adls in detail. set an alarm at night for medication, avoid sitting for long periods of time, correct bed positioning for sleeping, correct sequence for bed mobility, avoiding lifting more than 5 pounds and never wash directly over incision. All education is complete and patient  indicates understanding.     Vision Baseline Vision/History: Wears glasses Wears Glasses: At all times       Perception     Praxis      Pertinent Vitals/Pain Pain Assessment: Faces Faces Pain Scale: Hurts a little bit Pain Location: operative site Pain Descriptors / Indicators: Operative site guarding Pain Intervention(s): Premedicated before session;Monitored during session;Repositioned     Hand Dominance Right   Extremity/Trunk Assessment Upper Extremity Assessment Upper Extremity Assessment: Overall WFL for tasks assessed   Lower Extremity Assessment Lower Extremity Assessment: Overall WFL for tasks assessed   Cervical / Trunk Assessment Cervical / Trunk Assessment: Other exceptions(s/p surg)   Communication Communication Communication: HOH   Cognition Arousal/Alertness: Awake/alert Behavior During Therapy: WFL for tasks assessed/performed Overall Cognitive Status: Within Functional Limits for tasks assessed                                     General Comments  son in law present for entire education. requesting patient to have education on  need to lay down at times during the day and avoid sitting for long periods of time. OT provided this education    Exercises     Shoulder Instructions      Home Living Family/patient expects to be discharged to:: Private residence Living Arrangements: Spouse/significant other Available Help at Discharge: Family Type of Home: House Home Access: Ramped entrance     Home Layout: One level     Bathroom Shower/Tub: Tub/shower unit;Curtain   FirefighterBathroom Toilet: Handicapped height     Home Equipment: Environmental consultantWalker - 2 wheels   Additional Comments: daughter will have someone hired to (A) patient      Prior Functioning/Environment Level of Independence: Independent                 OT Problem List:        OT Treatment/Interventions:      OT Goals(Current goals can be found in the care plan section)  Acute Rehab OT Goals Patient Stated Goal: to go home asap  OT Frequency:     Barriers to D/C:            Co-evaluation              AM-PAC PT "6 Clicks" Daily Activity     Outcome Measure Help from another person eating meals?: None Help from another person taking care of personal grooming?: None Help from another person toileting, which includes using toliet, bedpan, or urinal?: None Help from another person bathing (including washing, rinsing, drying)?: None Help from another person to put on and taking off regular upper body clothing?: None Help from another person to put on and taking off regular lower body clothing?: None 6 Click Score: 24   End of Session Nurse Communication: Mobility status;Precautions  Activity Tolerance: Patient tolerated treatment well Patient left: in chair;with call bell/phone within reach;with family/visitor present                   Time: [id number] OT Time Calculation (min): 18 min Charges:  OT General Charges $OT Visit: 1 Visit OT Evaluation $OT Eval Moderate Complexity: 1 Mod G-Codes:     Danny Payne, Danny Payne   OTR/L Pager: [phone] Office: [phone] .  Danny Payne, Danny Payne 10/07/2017, 3:06 PM

## 2017-10-07 NOTE — Progress Notes (Signed)
Patient alert and oriented, mae's well, voiding adequate amount of urine, swallowing without difficulty, no c/o pain at time of discharge. Patient discharged home with family. Script and discharged instructions given to patient. Patient and family stated understanding of instructions given. Patient has an appointment with Dr. Elsner  

## 2017-10-09 ENCOUNTER — Other Ambulatory Visit: Payer: Self-pay

## 2017-10-09 ENCOUNTER — Encounter (HOSPITAL_COMMUNITY): Payer: Self-pay | Admitting: *Deleted

## 2017-10-09 ENCOUNTER — Emergency Department (HOSPITAL_COMMUNITY): Payer: Medicare Other

## 2017-10-09 ENCOUNTER — Emergency Department (HOSPITAL_COMMUNITY)
Admission: EM | Admit: 2017-10-09 | Discharge: 2017-10-09 | Disposition: A | Discharge status: Home / Self Care | Payer: Medicare Other | Attending: Emergency Medicine | Admitting: Emergency Medicine

## 2017-10-09 DIAGNOSIS — I2510 Atherosclerotic heart disease of native coronary artery without angina pectoris: Secondary | ICD-10-CM | POA: Insufficient documentation

## 2017-10-09 DIAGNOSIS — E039 Hypothyroidism, unspecified: Secondary | ICD-10-CM | POA: Diagnosis not present

## 2017-10-09 DIAGNOSIS — J45909 Unspecified asthma, uncomplicated: Secondary | ICD-10-CM | POA: Insufficient documentation

## 2017-10-09 DIAGNOSIS — K5903 Drug induced constipation: Secondary | ICD-10-CM | POA: Diagnosis not present

## 2017-10-09 DIAGNOSIS — N183 Chronic kidney disease, stage 3 (moderate): Secondary | ICD-10-CM | POA: Diagnosis not present

## 2017-10-09 DIAGNOSIS — I129 Hypertensive chronic kidney disease with stage 1 through stage 4 chronic kidney disease, or unspecified chronic kidney disease: Secondary | ICD-10-CM | POA: Insufficient documentation

## 2017-10-09 DIAGNOSIS — Z9049 Acquired absence of other specified parts of digestive tract: Secondary | ICD-10-CM | POA: Insufficient documentation

## 2017-10-09 DIAGNOSIS — Z87891 Personal history of nicotine dependence: Secondary | ICD-10-CM | POA: Diagnosis not present

## 2017-10-09 DIAGNOSIS — Z79899 Other long term (current) drug therapy: Secondary | ICD-10-CM | POA: Insufficient documentation

## 2017-10-09 DIAGNOSIS — E1122 Type 2 diabetes mellitus with diabetic chronic kidney disease: Secondary | ICD-10-CM | POA: Insufficient documentation

## 2017-10-09 DIAGNOSIS — K5900 Constipation, unspecified: Secondary | ICD-10-CM | POA: Diagnosis present

## 2017-10-09 DIAGNOSIS — Z794 Long term (current) use of insulin: Secondary | ICD-10-CM | POA: Diagnosis not present

## 2017-10-09 DIAGNOSIS — Z7982 Long term (current) use of aspirin: Secondary | ICD-10-CM | POA: Insufficient documentation

## 2017-10-09 LAB — COMPREHENSIVE METABOLIC PANEL
ALT: 31 U/L (ref 17–63)
AST: 32 U/L (ref 15–41)
Albumin: 3.7 g/dL (ref 3.5–5.0)
Alkaline Phosphatase: 82 U/L (ref 38–126)
Anion gap: 12 (ref 5–15)
BUN: 27 mg/dL — ABNORMAL HIGH (ref 6–20)
CHLORIDE: 95 mmol/L — AB (ref 101–111)
CO2: 22 mmol/L (ref 22–32)
Calcium: 9.6 mg/dL (ref 8.9–10.3)
Creatinine, Ser: 1.66 mg/dL — ABNORMAL HIGH (ref 0.61–1.24)
GFR, EST AFRICAN AMERICAN: 42 mL/min — AB (ref >60)
GFR, EST NON AFRICAN AMERICAN: 37 mL/min — AB (ref >60)
Glucose, Bld: 156 mg/dL — ABNORMAL HIGH (ref 65–99)
POTASSIUM: 3.9 mmol/L (ref 3.5–5.1)
SODIUM: 129 mmol/L — AB (ref 135–145)
Total Bilirubin: 2.5 mg/dL — ABNORMAL HIGH (ref 0.3–1.2)
Total Protein: 7.1 g/dL (ref 6.5–8.1)

## 2017-10-09 LAB — URINALYSIS, ROUTINE W REFLEX MICROSCOPIC
BACTERIA UA: NONE SEEN
Bilirubin Urine: NEGATIVE
GLUCOSE, UA: NEGATIVE mg/dL
KETONES UR: NEGATIVE mg/dL
LEUKOCYTES UA: NEGATIVE
Nitrite: NEGATIVE
PROTEIN: NEGATIVE mg/dL
SQUAMOUS EPITHELIAL / LPF: NONE SEEN
Specific Gravity, Urine: 1.004 — ABNORMAL LOW (ref 1.005–1.030)
WBC UA: NONE SEEN WBC/hpf (ref 0–5)
pH: 6 (ref 5.0–8.0)

## 2017-10-09 LAB — LIPASE, BLOOD: LIPASE: 23 U/L (ref 11–51)

## 2017-10-09 LAB — CBC
HEMATOCRIT: 39.7 % (ref 39.0–52.0)
HEMOGLOBIN: 13.4 g/dL (ref 13.0–17.0)
MCH: 28.6 pg (ref 26.0–34.0)
MCHC: 33.8 g/dL (ref 30.0–36.0)
MCV: 84.6 fL (ref 78.0–100.0)
PLATELETS: 127 10*3/uL — AB (ref 150–400)
RBC: 4.69 MIL/uL (ref 4.22–5.81)
RDW: 13.6 % (ref 11.5–15.5)
WBC: 9.1 10*3/uL (ref 4.0–10.5)

## 2017-10-09 MED ORDER — ACETAMINOPHEN 325 MG PO TABS
650.0000 mg | ORAL_TABLET | Freq: Once | ORAL | Status: AC
  Administered 2017-10-09: 650 mg via ORAL
  Filled 2017-10-09: qty 2

## 2017-10-09 MED ORDER — FLEET ENEMA 7-19 GM/118ML RE ENEM
1.0000 | ENEMA | Freq: Once | RECTAL | Status: AC
  Administered 2017-10-09: 1 via RECTAL
  Filled 2017-10-09: qty 1

## 2017-10-09 NOTE — Discharge Instructions (Signed)
Your testing today shows that you have had some constipation which is likely related to both your chronic constipation as well as medications which you have been taking for pain which have hydrocodone in them.  This will cause your stool to be harder and your bowels to be slower.  Because your bowels are slower you will have some abdominal distention, bloating and increased amounts of belching and nausea.  This may also cause lots of pain in your lower abdomen.  Please take the medications as follows  1.  MiraLAX 1 scoop in a glass of water twice a day  2.  Senna 1 tablet twice daily  3.  Colace stool softener twice daily  4.  Tylenol only for pain if possible, hydrocodone only for severe pain  If you should develop increasing pain fever vomiting or abdominal distention please return to the emergency department immediately.  You should be followed up at your doctor's office within 48 hours if still having constipation.  When you are having regular soft or loose stools you may cut back to 1 MiraLAX, 1 senna and one Colace daily.

## 2017-10-09 NOTE — ED Triage Notes (Signed)
Pt and family member reports pt had back surgery on Monday, having abd pain, distention and constipation since. Has nausea, no vomiting. Reports having small bm today around noon. Has been taking laxatives, stool softeners and miralax since last night.

## 2017-10-09 NOTE — ED Provider Notes (Signed)
[facility] EMERGENCY DEPARTMENT Provider Note   CSN: [id number] Arrival date & time: 10/09/17  1610     History   Chief Complaint Chief Complaint  Patient presents with  . Constipation  . Abdominal Pain    HPI Danny Payne is a 84 y.o. male.  HPI  The patient is an 84 year old male, he has a known history of stage III chronic kidney disease, he has benign prostatic hyperplasia, he also has a history of undergoing back surgery on Monday, he was in the hospital for just a day and a half, he received some pain medication during the procedure, after the procedure and was discharged with hydrocodone with acetaminophen which she has been taking 1 tablet every 6 hours for a couple of days.  He started to have some gradual abdominal distention, he has not had a bowel movement until today, he feels like he needs to have a bowel movement and has an intense pressure and a colicky abdominal pain associated with a bloated feeling.  There has been some difficulty passing urine as well.  The patient denies fevers chills coughing or shortness of breath, he has no swelling of his lower extremities, he has no dysuria and his back pain has become worse and today because he has not been taking his pain medicine but states that it is tolerable.  He has been taking MiraLAX twice a day, senna twice a day (has been on the senna for the last couple of weeks) and has been diagnosed with a history of chronic constipation.  Past Medical History:  Diagnosis Date  . Arthritis    shoulder  . Asthma    as a child  . BPH (benign prostatic hyperplasia)   . Chronic kidney disease    stage 3  . Colon polyp   . Constipation   . Coronary artery disease   . Depression   . Diabetes mellitus without complication (HCC)   . Diverticulitis   . Gastroparesis   . GERD (gastroesophageal reflux disease)   . History of hiatal hernia   . Hypertension   . Hypothyroidism   . Nephropathy   . Pneumonia     . Retinopathy due to secondary diabetes (HCC)   . Thrombocytopenia (HCC)   . Thyroid disease     Patient Active Problem List   Diagnosis Date Noted  . Lumbar radiculopathy, chronic 10/06/2017    Past Surgical History:  Procedure Laterality Date  . AORTIC VALVE REPLACEMENT    . CARDIAC CATHETERIZATION    . CHOLECYSTECTOMY    . COLONOSCOPY    . HERNIA REPAIR      groin on both side x 3   . KNEE DISLOCATION SURGERY Left   . LUMBAR LAMINECTOMY/DECOMPRESSION MICRODISCECTOMY Right 10/06/2017   Procedure: Right Lumbar Four-Five Laminectomy/Foraminotomy for facet/synovial cyst;  Surgeon: Barnett Abu, MD;  Location: [facility] OR;  Service: Neurosurgery;  Laterality: Right;  Right L4-5 Laminectomy/Foraminotomy for facet/synovial cyst  . SALIVARY STONE REMOVAL         Home Medications    Prior to Admission medications   Medication Sig Start Date End Date Taking? Authorizing Provider  albuterol (PROVENTIL HFA;VENTOLIN HFA) 108 (90 Base) MCG/ACT inhaler Inhale 2 puffs into the lungs every 6 (six) hours as needed for wheezing or shortness of breath.    [provider]  ALPRAZolam Prudy Feeler) 0.5 MG tablet Take 0.5 mg by mouth at bedtime as needed for anxiety (takes 1/2 tablet).    [provider]  aspirin EC 81 MG tablet Take 81 mg by mouth at bedtime.    [provider]  carvedilol (COREG) 12.5 MG tablet Take 12.5 mg by mouth 2 (two) times daily with a meal.    [provider]  [employer] SENNA 8.6 MG tablet Take 1 tablet by mouth 2 (two) times daily. 09/19/17   [provider]  fluticasone (FLONASE) 50 MCG/ACT nasal spray Place 2 sprays into both nostrils daily as needed for allergies or rhinitis.    [provider]  HUMALOG 100 UNIT/ML injection Inject 12-15 Units into the skin 3 (three) times daily with meals. Per sliding scale 09/06/17   [provider]  HYDROcodone-acetaminophen (NORCO/VICODIN) 5-325 MG tablet Take 1-2 tablets by mouth every  4 (four) hours as needed for severe pain ((score 7 to 10)). 10/06/17   Barnett Abu, MD  ipratropium-albuterol (DUONEB) 0.5-2.5 (3) MG/3ML SOLN Take 3 mLs by nebulization every 6 (six) hours as needed (for shortness of breath or wheezing).    [provider]  LEVEMIR 100 UNIT/ML injection Inject 30 Units into the skin at bedtime. 09/27/17   [provider]  levothyroxine (SYNTHROID, LEVOTHROID) 75 MCG tablet Take 75 mcg by mouth daily before breakfast.  09/15/17   [provider]  lisinopril (PRINIVIL,ZESTRIL) 40 MG tablet Take 40 mg by mouth daily.    [provider]  lovastatin (MEVACOR) 40 MG tablet Take 40 mg by mouth at bedtime. 09/01/17   [provider]  methocarbamol (ROBAXIN) 500 MG tablet Take 1 tablet (500 mg total) by mouth every 6 (six) hours as needed for muscle spasms. 10/06/17   Barnett Abu, MD  naproxen sodium (ALEVE) 220 MG tablet Take 220 mg by mouth daily as needed (for pain or headache).    [provider]  Omega-3 Fatty Acids (FISH OIL) 1000 MG CAPS Take 3,000 mg by mouth daily.    [provider]  polyethylene glycol (MIRALAX / GLYCOLAX) packet Take 17 g by mouth daily as needed for moderate constipation.    [provider]  triamterene-hydrochlorothiazide (MAXZIDE) 75-50 MG tablet Take 0.5 tablets by mouth daily. 08/13/17   [provider]    Family History History reviewed. No pertinent family history.  Social History Social History   Tobacco Use  . Smoking status: Former Smoker    Last attempt to quit: 10/04/1991    Years since quitting: 26.0  . Smokeless tobacco: Former Neurosurgeon    Types: Chew  Substance Use Topics  . Alcohol use: No    Frequency: Never  . Drug use: No     Allergies   Darvon [propoxyphene]; Metformin and related; Penicillins; Actos [pioglitazone]; Augmentin [amoxicillin-pot clavulanate]; Cozaar [losartan potassium]; Diovan [valsartan]; Zestril [lisinopril]; and  Lipitor [atorvastatin calcium]   Review of Systems Review of Systems  All other systems reviewed and are negative.    Physical Exam Updated Vital Signs BP (!) 140/57   Pulse 64   Temp 99.2 F (37.3 C) (Oral)   Resp 18   SpO2 96%   Physical Exam  Constitutional: He appears well-developed and well-nourished. No distress.  HENT:  Head: Normocephalic and atraumatic.  Mouth/Throat: Oropharynx is clear and moist. No oropharyngeal exudate.  Eyes: Conjunctivae and EOM are normal. Pupils are equal, round, and reactive to light. Right eye exhibits no discharge. Left eye exhibits no discharge. No scleral icterus.  Neck: Normal range of motion. Neck supple. No JVD present. No thyromegaly present.  Cardiovascular: Normal rate, regular rhythm, normal heart sounds  and intact distal pulses. Exam reveals no gallop and no friction rub.  No murmur heard. Pulmonary/Chest: Effort normal and breath sounds normal. No respiratory distress. He has no wheezes. He has no rales.  Abdominal: Soft. Bowel sounds are normal. He exhibits no distension and no mass. There is no tenderness.  The abdomen is very soft, it is distended, there is some tympanitic sounds to percussion, there is minimal tenderness, mostly in the left lower quadrant.  No guarding, no peritoneal signs  Genitourinary:  Genitourinary Comments: Rectal exam with normal-appearing anus, no hemorrhoids, no fissures, no masses, no blood in the stool or the rectal vault, there is no formed stool in the rectal vault, the prostate was slightly lumpy, this was told to the patient and his significant family member, they will follow-up in the outpatient setting  Musculoskeletal: Normal range of motion. He exhibits no edema or tenderness.  Lymphadenopathy:    He has no cervical adenopathy.  Neurological: He is alert. Coordination normal.  Skin: Skin is warm and dry. No rash noted. No erythema.  Psychiatric: He has a normal mood and affect. His behavior is  normal.  Nursing note and vitals reviewed.    ED Treatments / Results  Labs (all labs ordered are listed, but only abnormal results are displayed) Labs Reviewed  COMPREHENSIVE METABOLIC PANEL - Abnormal; Notable for the following components:      Result Value   Sodium 129 (*)    Chloride 95 (*)    Glucose, Bld 156 (*)    BUN 27 (*)    Creatinine, Ser 1.66 (*)    Total Bilirubin 2.5 (*)    GFR calc non Af Amer 37 (*)    GFR calc Af Amer 42 (*)    All other components within normal limits  CBC - Abnormal; Notable for the following components:   Platelets 127 (*)    All other components within normal limits  URINALYSIS, ROUTINE W REFLEX MICROSCOPIC - Abnormal; Notable for the following components:   Color, Urine STRAW (*)    Specific Gravity, Urine 1.004 (*)    Hgb urine dipstick SMALL (*)    All other components within normal limits  LIPASE, BLOOD     Radiology Dg Abdomen Acute W/chest  Result Date: 10/09/2017 CLINICAL DATA:  Abdominal pain with distension EXAM: DG ABDOMEN ACUTE W/ 1V CHEST COMPARISON:  None. FINDINGS: Single-view chest demonstrates post sternotomy changes. Mild cardiomegaly with aortic atherosclerosis. Patchy atelectasis at the right middle lobe. Supine and upright views of the abdomen demonstrate no free air beneath the diaphragm. There are surgical clips in the right upper quadrant. Nonobstructed bowel-gas pattern with moderate stool in the colon. Calcified pelvic phleboliths. IMPRESSION: 1. Patchy atelectasis at the right middle lobe.  Cardiomegaly. 2. Nonobstructed bowel-gas pattern with moderate stool in the colon. Electronically Signed   By: Jasmine PangKim  Fujinaga M.D.   On: 10/09/2017 18:10    Procedures Procedures (including critical care time)  Medications Ordered in ED Medications  sodium phosphate (FLEET) 7-19 GM/118ML enema 1 enema (1 enema Rectal Given 10/09/17 2219)  acetaminophen (TYLENOL) tablet 650 mg (650 mg Oral Given 10/09/17 2314)     Initial  Impression / Assessment and Plan / ED Course  I have reviewed the triage vital signs and the nursing notes.  Pertinent labs & imaging results that were available during my care of the patient were reviewed by me and considered in my medical decision making (see chart for details).    The patient's  exam is consistent with constipation, he does not have a bowel obstruction and his laboratory workup is reassuring except for mild hyponatremia.  Urinalysis shows no signs of infection, I suspect he has increased stool burden which is also evident on the x-rays.  Will obtain a enema, rectal exam to see if he needs to be disimpacted otherwise the patient has a stable nonsurgical abdomen.  Doubt bowel obstruction  I gave guidance to the family member on using stool softeners and laxatives at home and encouraged them to use non-opiate medications for pain control.  The hyponatremia is minimal  The constipation was treated with an enema which the patient states was productive of stool, he still feels mildly bloated but has had no vomiting since arrival.  Abdomen again is nontender, I went over the exact medication dosing with the patient and his family member and they are in agreement.  Final Clinical Impressions(s) / ED Diagnoses   Final diagnoses:  Drug-induced constipation      Eber Hong, MD 10/09/17 2318

## 2019-09-02 IMAGING — CR DG LUMBAR SPINE 2-3V
2 series · 2 of 2 positions shown · non-contrast
Comparison: 09/24/2017

CLINICAL DATA: Intraoperative localization

EXAM:
LUMBAR SPINE - 2-3 VIEW

[xtable lateral (1 of 2)]
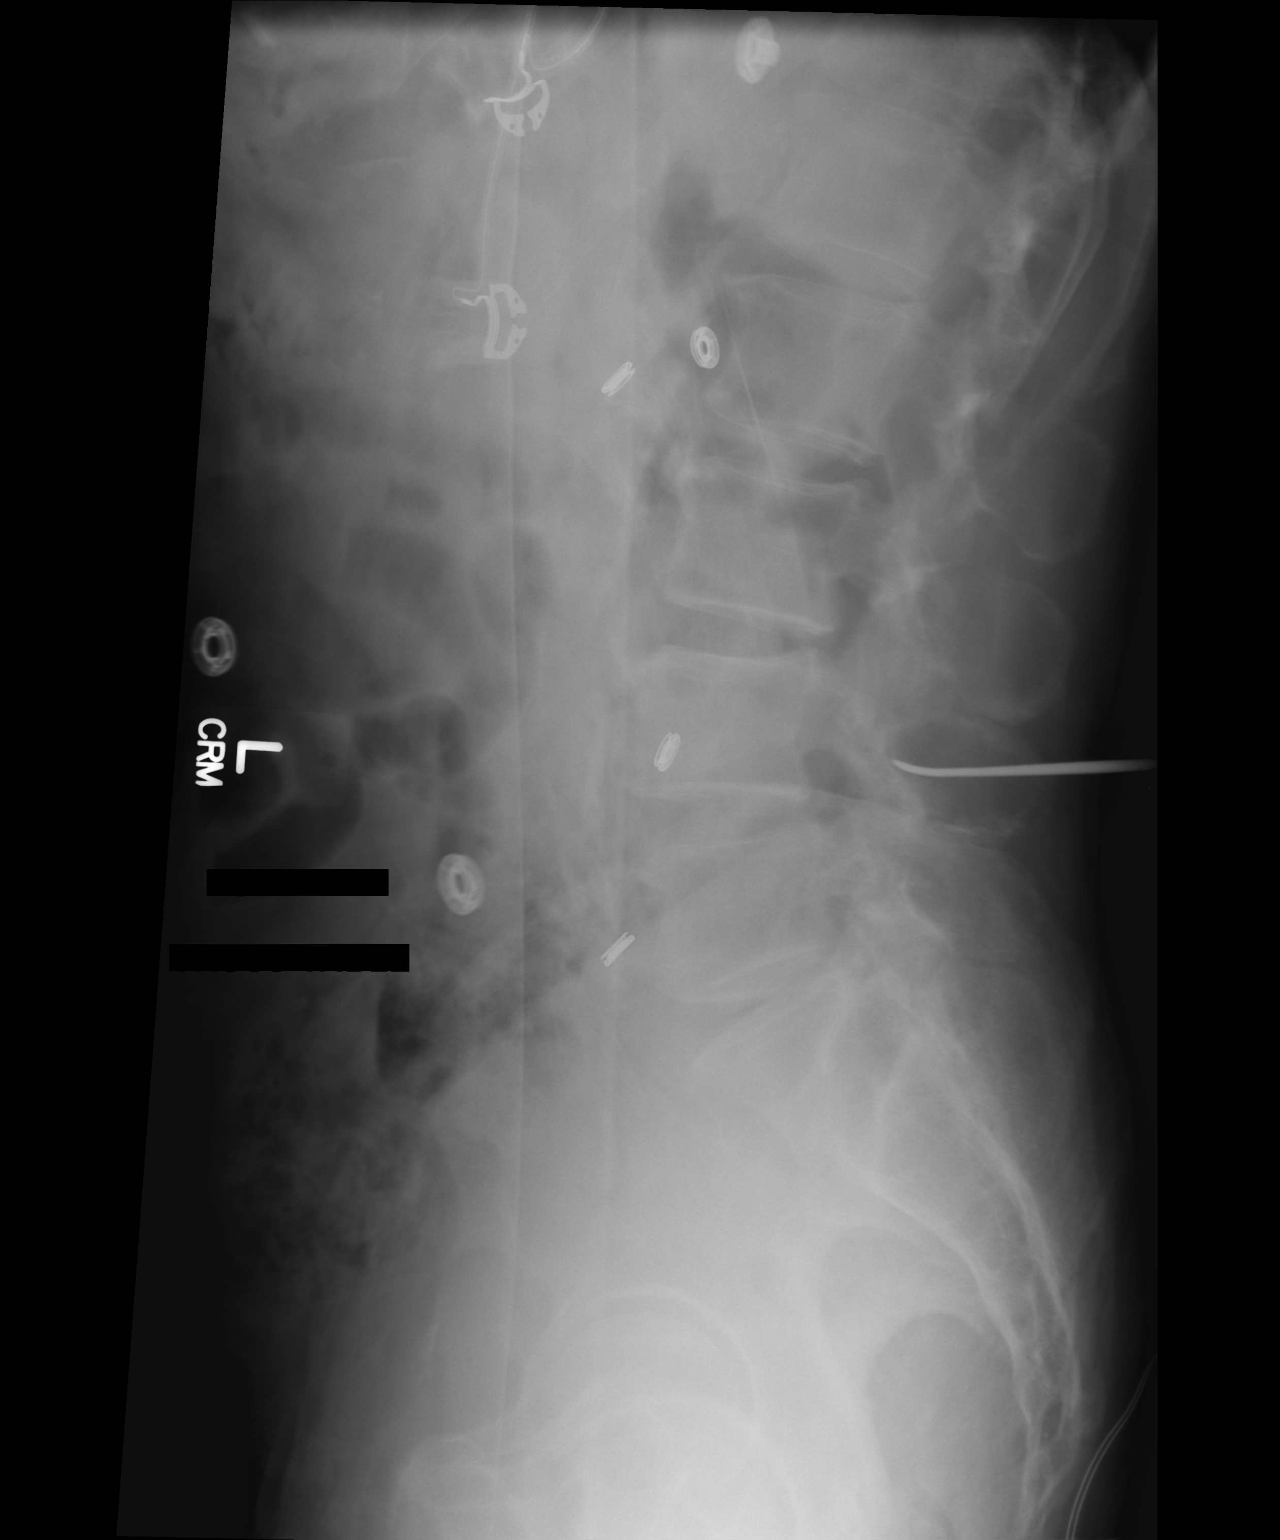

[xtable lateral (2 of 2)]
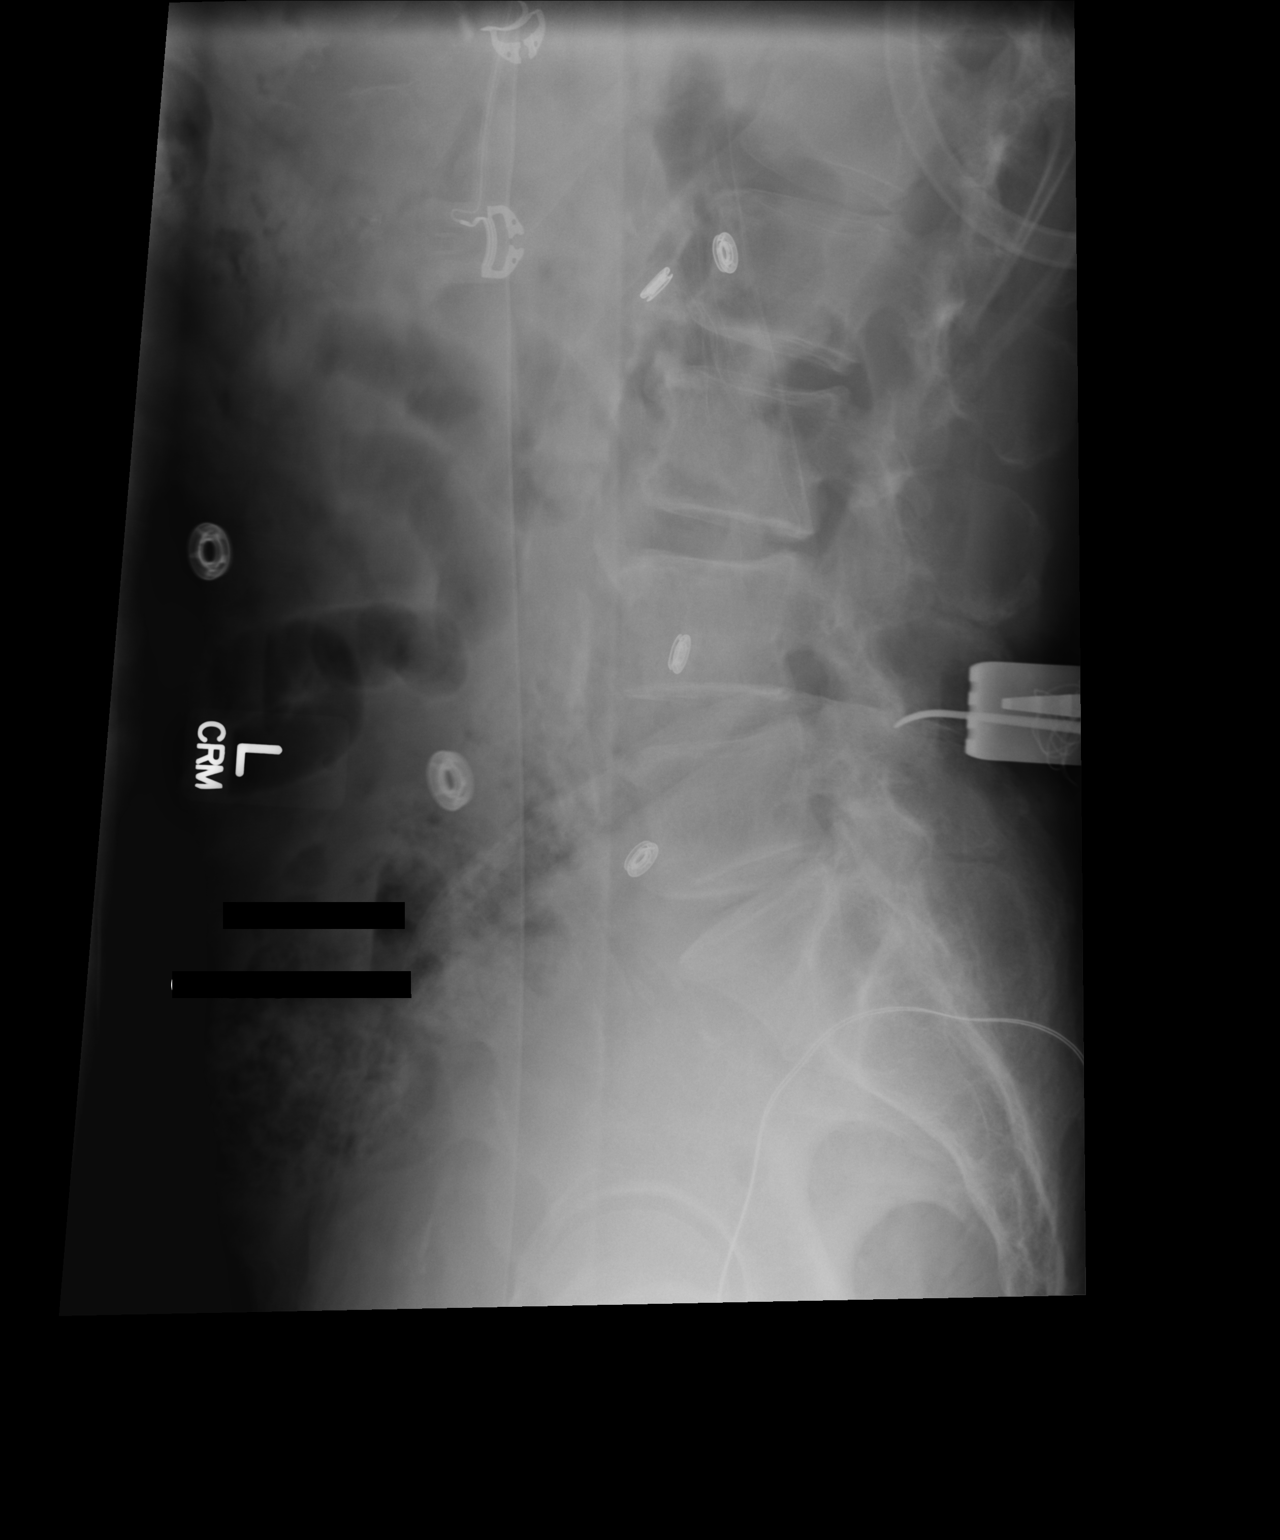

[2 of 2 positions shown; findings below may reference images not displayed]

FINDINGS: Posterior surgical instrument is directed at the inferior L4
vertebral body.
IMPRESSION: Intraoperative localization as above.

## 2019-09-05 IMAGING — DX DG ABDOMEN ACUTE W/ 1V CHEST
3 series · 3 of 3 positions shown · non-contrast
Comparison: None.

CLINICAL DATA: Abdominal pain with distension

EXAM:
DG ABDOMEN ACUTE W/ 1V CHEST

[chest pa]
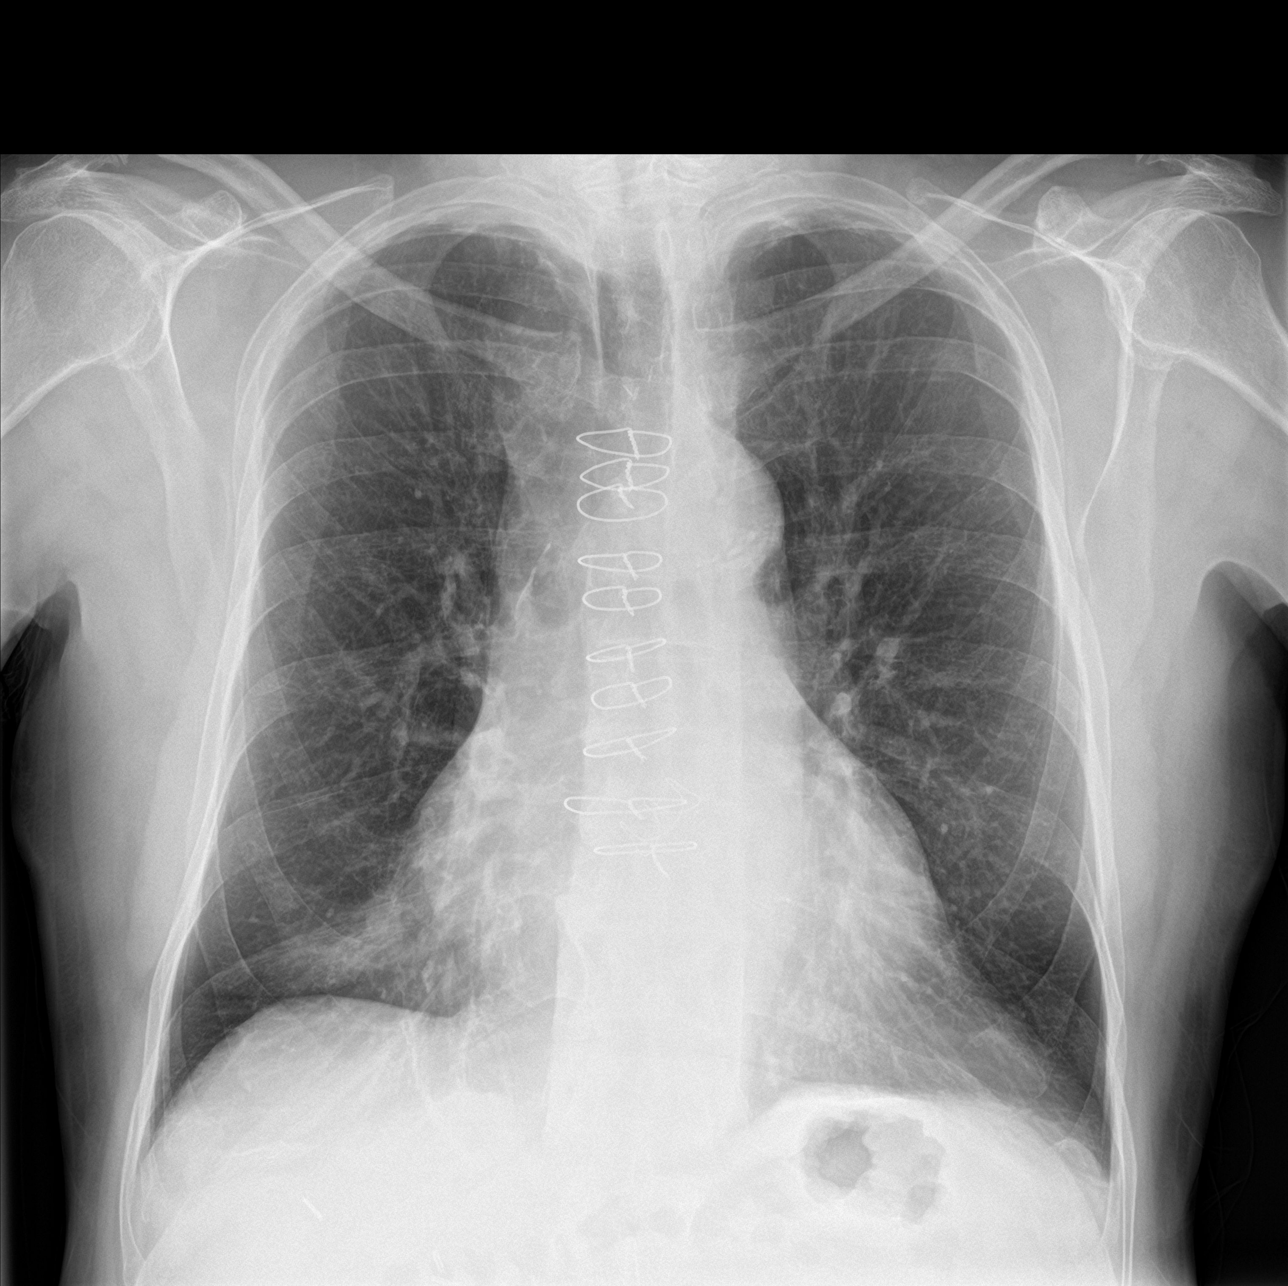

[abdomen erect]
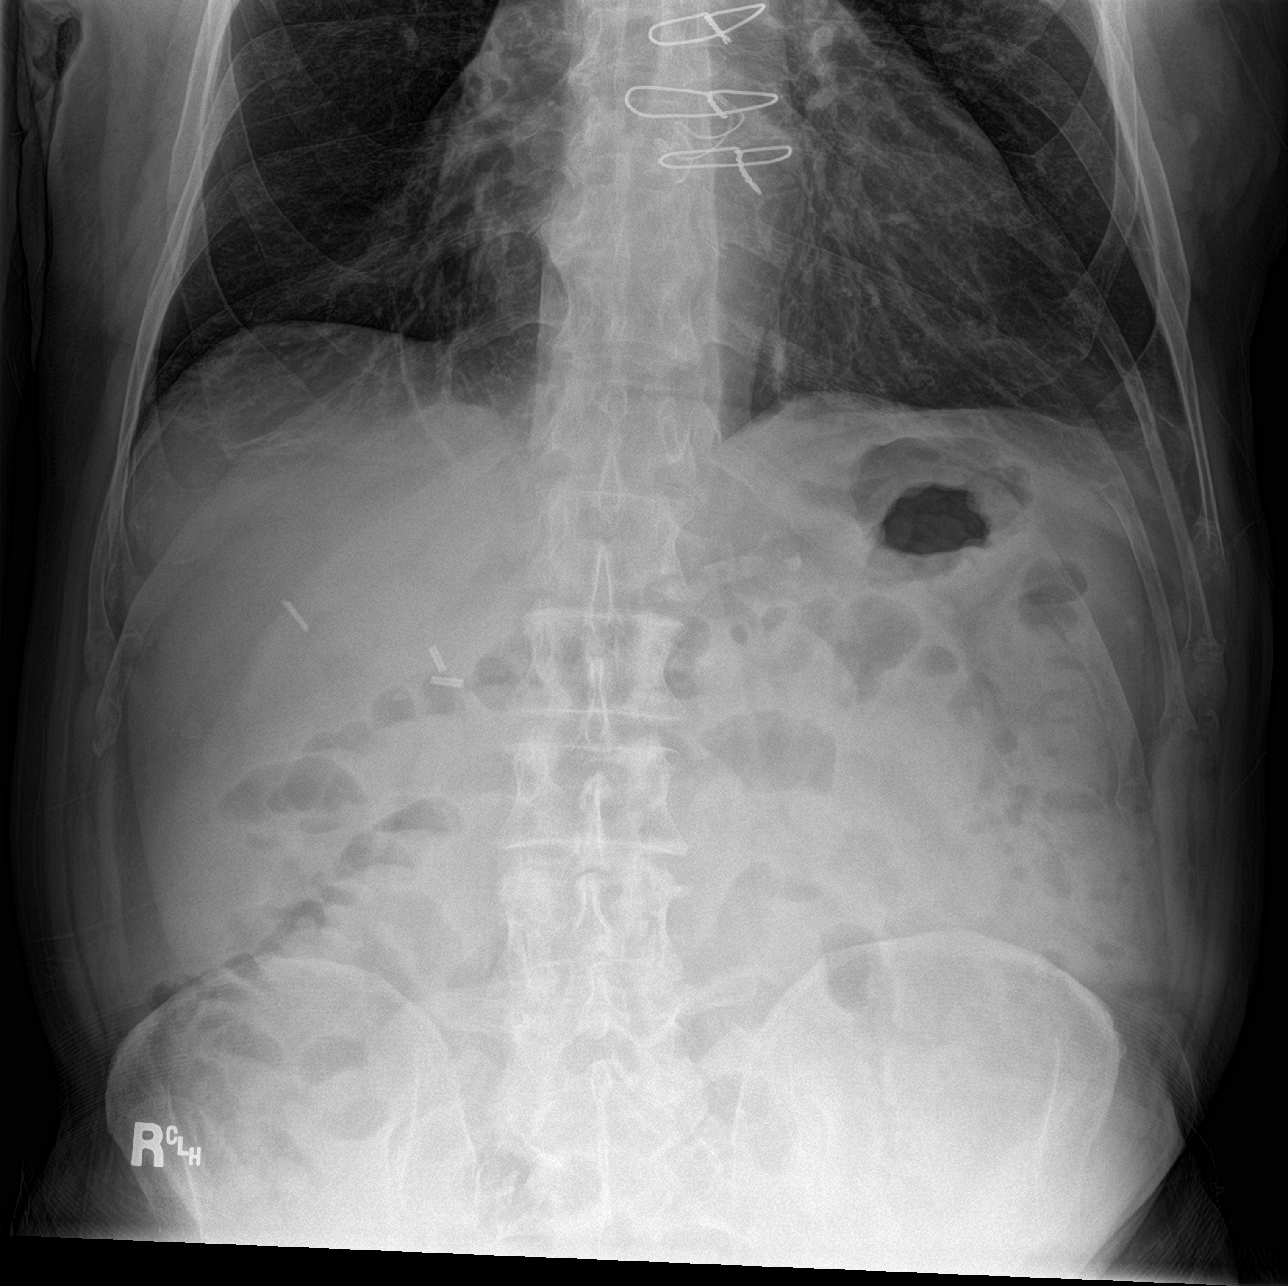

[abdomen supine]
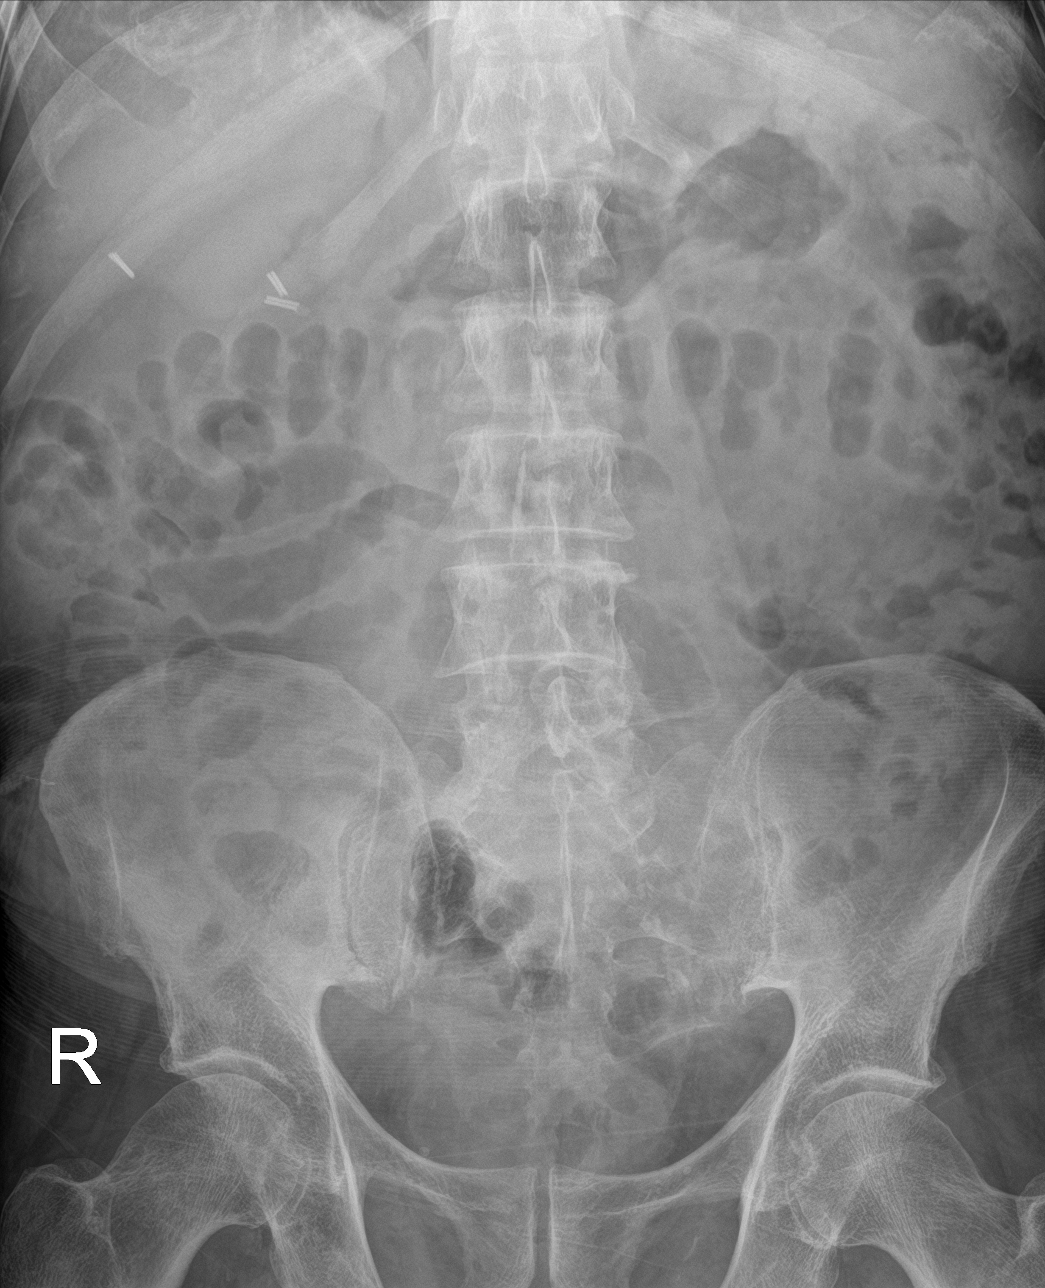

[3 of 3 positions shown; findings below may reference images not displayed]

FINDINGS: Single-view chest demonstrates post sternotomy changes. Mild
cardiomegaly with aortic atherosclerosis. Patchy atelectasis at the
right middle lobe.

Supine and upright views of the abdomen demonstrate no free air
beneath the diaphragm. There are surgical clips in the right upper
quadrant. Nonobstructed bowel-gas pattern with moderate stool in the
colon. Calcified pelvic phleboliths.
IMPRESSION: 1. Patchy atelectasis at the right middle lobe.  Cardiomegaly.
2. Nonobstructed bowel-gas pattern with moderate stool in the colon.
